# Patient Record
Sex: Female | Born: 1965 | Race: White | Hispanic: No | Marital: Married | Smoking: Never smoker
Health system: Southern US, Community
[De-identification: ages and names within clinical notes are randomized; demographics above are authoritative.]

## PROBLEM LIST (undated history)

## (undated) DIAGNOSIS — E785 Hyperlipidemia, unspecified: Secondary | ICD-10-CM

## (undated) DIAGNOSIS — I10 Essential (primary) hypertension: Secondary | ICD-10-CM

## (undated) HISTORY — DX: Essential (primary) hypertension: I10

## (undated) HISTORY — DX: Hyperlipidemia, unspecified: E78.5

---

## 1998-08-15 ENCOUNTER — Other Ambulatory Visit: Admission: RE | Admit: 1998-08-15 | Discharge: 1998-08-15 | Payer: Self-pay | Admitting: Obstetrics and Gynecology

## 1999-01-19 ENCOUNTER — Other Ambulatory Visit: Admission: RE | Admit: 1999-01-19 | Discharge: 1999-01-19 | Payer: Self-pay | Admitting: *Deleted

## 2000-02-02 ENCOUNTER — Other Ambulatory Visit: Admission: RE | Admit: 2000-02-02 | Discharge: 2000-02-02 | Payer: Self-pay | Admitting: *Deleted

## 2001-06-13 ENCOUNTER — Other Ambulatory Visit: Admission: RE | Admit: 2001-06-13 | Discharge: 2001-06-13 | Payer: Self-pay | Admitting: Obstetrics and Gynecology

## 2001-11-26 ENCOUNTER — Encounter: Payer: Self-pay | Admitting: Orthopedic Surgery

## 2001-11-26 ENCOUNTER — Inpatient Hospital Stay (HOSPITAL_COMMUNITY): Admission: EM | Admit: 2001-11-26 | Discharge: 2001-11-30 | Payer: Self-pay | Admitting: *Deleted

## 2002-06-16 ENCOUNTER — Other Ambulatory Visit: Admission: RE | Admit: 2002-06-16 | Discharge: 2002-06-16 | Payer: Self-pay | Admitting: Obstetrics and Gynecology

## 2002-06-26 HISTORY — PX: FRACTURE SURGERY: SHX138

## 2003-06-29 ENCOUNTER — Other Ambulatory Visit: Admission: RE | Admit: 2003-06-29 | Discharge: 2003-06-29 | Payer: Self-pay | Admitting: Obstetrics and Gynecology

## 2004-04-26 ENCOUNTER — Ambulatory Visit: Payer: Self-pay | Admitting: Internal Medicine

## 2005-12-05 ENCOUNTER — Ambulatory Visit: Payer: Self-pay | Admitting: Internal Medicine

## 2007-06-27 HISTORY — PX: COLONOSCOPY: SHX174

## 2007-07-18 ENCOUNTER — Ambulatory Visit: Payer: Self-pay | Admitting: Internal Medicine

## 2007-08-01 ENCOUNTER — Encounter: Payer: Self-pay | Admitting: Internal Medicine

## 2007-08-01 ENCOUNTER — Ambulatory Visit: Payer: Self-pay | Admitting: Internal Medicine

## 2009-06-10 ENCOUNTER — Emergency Department (HOSPITAL_COMMUNITY): Admission: EM | Admit: 2009-06-10 | Discharge: 2009-06-10 | Payer: Self-pay | Admitting: Emergency Medicine

## 2010-09-26 LAB — CBC
HCT: 35 % — ABNORMAL LOW (ref 36.0–46.0)
Hemoglobin: 12.3 g/dL (ref 12.0–15.0)
MCHC: 35.2 g/dL (ref 30.0–36.0)
MCV: 91.8 fL (ref 78.0–100.0)
Platelets: 254 10*3/uL (ref 150–400)
RBC: 3.81 MIL/uL — ABNORMAL LOW (ref 3.87–5.11)
RDW: 12.3 % (ref 11.5–15.5)
WBC: 12.8 10*3/uL — ABNORMAL HIGH (ref 4.0–10.5)

## 2010-09-26 LAB — URINALYSIS, ROUTINE W REFLEX MICROSCOPIC
Glucose, UA: NEGATIVE mg/dL
Ketones, ur: 40 mg/dL — AB
Nitrite: NEGATIVE
pH: 7.5 (ref 5.0–8.0)

## 2010-09-26 LAB — DIFFERENTIAL
Basophils Absolute: 0 10*3/uL (ref 0.0–0.1)
Basophils Relative: 0 % (ref 0–1)
Lymphocytes Relative: 9 % — ABNORMAL LOW (ref 12–46)
Monocytes Absolute: 0.1 10*3/uL (ref 0.1–1.0)
Neutro Abs: 11.6 10*3/uL — ABNORMAL HIGH (ref 1.7–7.7)
Neutrophils Relative %: 90 % — ABNORMAL HIGH (ref 43–77)

## 2010-09-26 LAB — POCT I-STAT, CHEM 8
BUN: 15 mg/dL (ref 6–23)
Calcium, Ion: 1.04 mmol/L — ABNORMAL LOW (ref 1.12–1.32)
Chloride: 110 mEq/L (ref 96–112)
HCT: 34 % — ABNORMAL LOW (ref 36.0–46.0)
Potassium: 3.7 mEq/L (ref 3.5–5.1)
Sodium: 139 mEq/L (ref 135–145)

## 2010-11-11 NOTE — Op Note (Signed)
[place]. [facility]  Patient:    Shelia Rush, Shelia Rush Visit Number: [id number] MRN: [id number]          Service Type: MED Location: 5000 [id number] Attending Physician:  Loanne Drilling Dictated by:   Ollen Gross, M.D. Proc. Date: 11/26/01 Admit Date:  11/26/2001                             Operative Report  PREOPERATIVE DIAGNOSIS:  Valgus impacted left femoral neck fracture.  POSTOPERATIVE DIAGNOSIS:  Valgus impacted left femoral neck fracture.  OPERATION PERFORMED:  In situ pinning, left femoral neck fracture.  SURGEON:  Ollen Gross, M.D.  ASSISTANT: None.  ANESTHESIA:  General.  ESTIMATED BLOOD LOSS:  Minimal.  DRAINS:  Hemovac times one.  COMPLICATIONS:  None.  CONDITION:  Stable to recovery.  INDICATIONS FOR PROCEDURE:  The patient is a 45 year old female who fell on a waxed floor earlier this evening, sustaining a valgus impacted left femoral neck fracture.  She presents now for in situ pinning.  DESCRIPTION OF PROCEDURE:  After successful administration of general anesthetic, the patient was placed on a Jackson fracture table and the left lower extremity was placed into the traction boot which was well padded.  The right lower extremity was placed in a Well leg holder.  Fluoro was brought into the field and the fracture was confirmed to be valgus impacted and under live fluoroscopy was moving as a complete unit.  The hip was then prepped and draped in the usual sterile fashion.  Under fluoroscopy guidance, guide pins placed over the thigh so as to align the center of the femoral head and neck for purposes of marking our incision.  Incision was made with a 10 blade.  The subcutaneous tissue to the level of the fascia lata was incised in line with the skin incision.  The fascia of the vastus lateralis was also incised.  A small incision made.  Muscle fibers split to go down to the lateral cortex of the femur. Under fluoroscopy  guidance, three parallel guide pins were placed such as to go at a 90 degree angle to the fracture line and enter the femoral head in the central and then slightly inferior position at the inferior most screw.  The pins were found to be in the center of the femoral neck on the lateral.  The lengths were 80, 80 and 75 mm respectively.  Once the screws were placed, the guide pins are removed and fluoro spots taken AP and lateral to confirm good position.  The wound was copiously irrigated with saline, then the fascia of the vastus lateralis was closed with interrupted #1 Vicryl, the fascia lata closed with interrupted #1 Vicryl and subcutaneous closed with #1 and then 2-0 Vicryl.  The subcuticular was closed with running 4-0 Monocryl. Incision was cleaned and dried, Steri-Strips and sterile bulky dressing applied.  The thigh was wrapped in an Ace wrap.  She was awakened and transported to recovery in stable condition. Dictated by:   Ollen Gross, M.D. Attending Physician:  Loanne Drilling DD:  11/26/01 TD:  11/28/01 Job: [id number]

## 2010-11-11 NOTE — Discharge Summary (Signed)
[place]. [facility]  Patient:    Shelia Rush, Shelia Rush Visit Number: [id number] MRN: [id number]          Service Type: MED Location: 5000 5025 01 Attending Physician:  Loanne Drilling Dictated by:   Ottie Glazier Wynona Neat, P.A.-C. Admit Date:  11/26/2001 Discharge Date: 11/30/2001                             Discharge Summary  ADMISSION DIAGNOSIS:  Left femoral neck fracture.  DISCHARGE DIAGNOSIS:  Left femoral neck fracture, improved.  PROCEDURES PERFORMED:  In situ pinning of the left femoral neck by Dr. Ollen Gross.  CONSULTS:  None.  HISTORY OF PRESENT ILLNESS:  The patient is a 45 year old female who was seen and evaluated in the [facility] ER by Dr. Ollen Gross on November 26, 2001, after sustaining a fall while waxing her floors at home. The patient states she felt immediate left buttock and immediate left hip pain with an inability to ambulate thereafter. On the time of examination the patient was found to have left hip tenderness to the greater trochanter, pain with any attempted range of motion. There was no left lower extremity deformity noted. Sensation was intact, 2+ dorsalis pedis pulses were intact. X-ray showed a valgus impacted left femoral neck fracture. Due to the nature of this injury, the decision was made to proceed with operative intervention in the form of in situ pinning.  The risks and benefits were discussed with the patient in great detail prior to entering the operating suite.  HOSPITAL COURSE:  The patient underwent the above procedure per Dr. Ollen Gross without complications. Please see operative report for details. On postoperative day #1, overall the patient was doing well, however, she was complaining of pruritus and mild tingling in her left foot. She was afebrile. Her vital signs were stable. Neurovascularly she was grossly intact in the left lower extremity. Her motor functioning showed good dorsi and  plantar flexion. Her dressings were clean, dry and intact. Benadryl was added in hopes to relieve her pruritus. She will begin physical therapy and occupational therapy that day, weightbearing as tolerated. Her hemoglobin was noted to be 11.2.  On postoperative day #1 the patient was without complaints, saying that her leg felt much better. She was afebrile and vital signs were stable. The left lower extremity incision was clean, dry and intact. Neurovascularly she was intact. She was to continue with physical therapy with the discharge plans being made for when she completed her goals with PT.  The following day the patient did complain of significant thigh pain. She was afebrile. She remained somewhat tachycardic. Vital signs were stable otherwise and the incision was healing well with no signs of any hematoma. Her quad strength was tested and found to be slowly returning.  On postoperative day #4 the patient was up in a chair in very good spirits. She had no complaints. Her pain was well controlled. She was eager for discharge. She had progressed well with physical therapy and in fact, after discussion with the physical therapist, it was decided that the patient was able for discharge to home at this time. Her vital signs were stable, she was afebrile and her INR was 2.1. The left hip showed her dressing was clean, dry and intact. The incision was clean, dry and intact. Motor and neurovascularly she was intact.  FINAL DIAGNOSIS:  Left hip pinning.  CONDITION ON DISCHARGE:  Improved.  DISCHARGE INSTRUCTIONS:  Regular diet. Activity is weightbearing as tolerated. Keep dressings clean, dry and intact. She may shower in the morning.  DISCHARGE MEDICATIONS: 1. Percocet 1 to 2 p.o. q.4-6h. p.r.n., #40. 2. Robaxin 1 p.o. q.6h. p.r.n., #40. 3. Coumadin 5 mg 1 p.o. q.d. per pharmacy, #25.  FOLLOWUP:  Follow up with Dr. Lequita Halt in two weeks. She is to call [phone] for an appointment.  Postoperatively she will be followed by [facility] for management of her outpatient physical therapy, occupational therapy and Coumadin management. Dictated by:   Ottie Glazier. Wynona Neat, P.A.-C. Attending Physician:  Loanne Drilling DD:  12/26/00 TD:  12/30/01 Job: [id number]

## 2012-07-26 ENCOUNTER — Encounter: Payer: Self-pay | Admitting: Internal Medicine

## 2013-12-16 ENCOUNTER — Ambulatory Visit (INDEPENDENT_AMBULATORY_CARE_PROVIDER_SITE_OTHER): Payer: 59 | Admitting: Emergency Medicine

## 2013-12-16 VITALS — BP 128/78 | HR 126 | Temp 100.2°F | Resp 20 | Ht 65.0 in | Wt 181.0 lb

## 2013-12-16 DIAGNOSIS — N10 Acute tubulo-interstitial nephritis: Secondary | ICD-10-CM

## 2013-12-16 DIAGNOSIS — R300 Dysuria: Secondary | ICD-10-CM

## 2013-12-16 LAB — POCT URINALYSIS DIPSTICK
Bilirubin, UA: NEGATIVE
Glucose, UA: NEGATIVE
Ketones, UA: NEGATIVE
Nitrite, UA: NEGATIVE
Protein, UA: 30
Spec Grav, UA: 1.01
Urobilinogen, UA: 0.2
pH, UA: 5.5

## 2013-12-16 LAB — POCT UA - MICROSCOPIC ONLY
Casts, Ur, LPF, POC: NEGATIVE
Crystals, Ur, HPF, POC: NEGATIVE
Mucus, UA: POSITIVE
Yeast, UA: NEGATIVE

## 2013-12-16 MED ORDER — CIPROFLOXACIN HCL 500 MG PO TABS: 500.0000 mg | ORAL_TABLET | Freq: Two times a day (BID) | ORAL | Status: DC

## 2013-12-16 MED ORDER — IBUPROFEN 600 MG PO TABS: 600.0000 mg | ORAL_TABLET | Freq: Three times a day (TID) | ORAL | Status: DC | PRN

## 2013-12-16 MED ORDER — PHENAZOPYRIDINE HCL 200 MG PO TABS: 200.0000 mg | ORAL_TABLET | Freq: Three times a day (TID) | ORAL | Status: DC | PRN

## 2013-12-16 NOTE — Patient Instructions (Signed)
Pyelonephritis, Adult °Pyelonephritis is a kidney infection. In general, there are 2 main types of pyelonephritis: °· Infections that come on quickly without any warning (acute pyelonephritis). °· Infections that persist for a long period of time (chronic pyelonephritis). °CAUSES  °Two main causes of pyelonephritis are: °· Bacteria traveling from the bladder to the kidney. This is a problem especially in pregnant women. The urine in the bladder can become filled with bacteria from multiple causes, including: °¨ Inflammation of the prostate gland (prostatitis). °¨ Sexual intercourse in females. °¨ Bladder infection (cystitis). °· Bacteria traveling from the bloodstream to the tissue part of the kidney. °Problems that may increase your risk of getting a kidney infection include: °· Diabetes. °· Kidney stones or bladder stones. °· Cancer. °· Catheters placed in the bladder. °· Other abnormalities of the kidney or ureter. °SYMPTOMS  °· Abdominal pain. °· Pain in the side or flank area. °· Fever. °· Chills. °· Upset stomach. °· Blood in the urine (dark urine). °· Frequent urination. °· Strong or persistent urge to urinate. °· Burning or stinging when urinating. °DIAGNOSIS  °Your caregiver may diagnose your kidney infection based on your symptoms. A urine sample may also be taken. °TREATMENT  °In general, treatment depends on how severe the infection is.  °· If the infection is mild and caught early, your caregiver may treat you with oral antibiotics and send you home. °· If the infection is more severe, the bacteria may have gotten into the bloodstream. This will require intravenous (IV) antibiotics and a hospital stay. Symptoms may include: °¨ High fever. °¨ Severe flank pain. °¨ Shaking chills. °· Even after a hospital stay, your caregiver may require you to be on oral antibiotics for a period of time. °· Other treatments may be required depending upon the cause of the infection. °HOME CARE INSTRUCTIONS  °· Take your  antibiotics as directed. Finish them even if you start to feel better. °· Make an appointment to have your urine checked to make sure the infection is gone. °· Drink enough fluids to keep your urine clear or pale yellow. °· Take medicines for the bladder if you have urgency and frequency of urination as directed by your caregiver. °SEEK IMMEDIATE MEDICAL CARE IF:  °· You have a fever or persistent symptoms for more than 2-3 days. °· You have a fever and your symptoms suddenly get worse. °· You are unable to take your antibiotics or fluids. °· You develop shaking chills. °· You experience extreme weakness or fainting. °· There is no improvement after 2 days of treatment. °MAKE SURE YOU: °· Understand these instructions. °· Will watch your condition. °· Will get help right away if you are not doing well or get worse. °Document Released: 06/12/2005 Document Revised: 12/12/2011 Document Reviewed: 11/16/2010 °ExitCare® Patient Information ©2015 [employer]. This information is not intended to replace advice given to you by your health care provider. Make sure you discuss any questions you have with your health care provider. ° °

## 2013-12-16 NOTE — Progress Notes (Signed)
   Subjective:    Patient ID: Shelia Rush, female    DOB: 01/21/1966, 48 y.o.   MRN: [id number]  HPI Dysuria, urgency and frequency.  Onset  Almost 2 weeks ago.  Progressed to back pain and fever with body aches over last 24 hours.  No nausea or vomiting.  No history of prior UTIs.  No UTI risk factors.  PPMH:  Negative for diabetes or hypertension  SH:  Nonsmoker, no alcohol.   Review of Systems  Constitutional: Positive for fever, chills and fatigue.  Respiratory: Negative for cough and shortness of breath.   Gastrointestinal: Negative for nausea, vomiting, abdominal pain, diarrhea and constipation.  Genitourinary: Positive for dysuria, urgency, frequency, flank pain and decreased urine volume.  Musculoskeletal: Positive for back pain. Negative for neck pain.  Neurological: Positive for headaches. Negative for weakness and numbness.  Otherwise all systems negative     Objective:   Physical Exam Blood pressure 128/78, pulse 126, temperature 100.2 F (37.9 C), temperature source Oral, resp. rate 20, height 5\' 5"  (1.651 m), weight 181 lb (82.101 kg), last menstrual period 11/30/2013, SpO2 96.00%. Body mass index is 30.12 kg/(m^2). Well-developed, well nourished female who is awake, alert and oriented, in NAD. HEENT: [place]/AT, PERRL, EOMI.  Sclera and conjunctiva are clear.   Neck: supple, non-tender, no lymphadenopathy, thyromegaly. Heart: RRR, no murmur Lungs: normal effort, CTA Back:  Mild bilateral CVA tenderness Abdomen: normo-active bowel sounds, supple, non-tender, no mass or organomegaly. Extremities: no cyanosis, clubbing or edema. Skin: warm and dry without rash. Psychologic: good mood and appropriate affect, normal speech and behavior.  Results for orders placed in visit on 12/16/13  POCT URINALYSIS DIPSTICK      Result Value Ref Range   Color, UA yellow     Clarity, UA hazy     Glucose, UA neg     Bilirubin, UA neg     Ketones, UA neg     Spec Grav, UA 1.010      Blood, UA tr-lysed     pH, UA 5.5     Protein, UA 30     Urobilinogen, UA 0.2     Nitrite, UA neg     Leukocytes, UA moderate (2+)    POCT UA - MICROSCOPIC ONLY      Result Value Ref Range   WBC, Ur, HPF, POC 30-40     RBC, urine, microscopic 5-10     Bacteria, U Microscopic 2+     Mucus, UA positive     Epithelial cells, urine per micros 3-7     Crystals, Ur, HPF, POC neg     Casts, Ur, LPF, POC neg     Yeast, UA neg         Assessment & Plan:  UTI/Pyelonephritis Will treat with Cipro x 10 days.  Pyridium prn and Motrin 600mg  Q6h.  Culture sent.  Patient has appointment with PCP in am and will keep it in am.

## 2013-12-17 ENCOUNTER — Telehealth: Payer: Self-pay

## 2013-12-17 ENCOUNTER — Encounter (HOSPITAL_COMMUNITY): Payer: Self-pay | Admitting: Emergency Medicine

## 2013-12-17 ENCOUNTER — Emergency Department (HOSPITAL_COMMUNITY)
Admission: EM | Admit: 2013-12-17 | Discharge: 2013-12-18 | Disposition: A | Discharge status: Home / Self Care | Payer: 59 | Attending: Emergency Medicine | Admitting: Emergency Medicine

## 2013-12-17 DIAGNOSIS — Z792 Long term (current) use of antibiotics: Secondary | ICD-10-CM | POA: Insufficient documentation

## 2013-12-17 DIAGNOSIS — N12 Tubulo-interstitial nephritis, not specified as acute or chronic: Secondary | ICD-10-CM

## 2013-12-17 DIAGNOSIS — R51 Headache: Secondary | ICD-10-CM | POA: Insufficient documentation

## 2013-12-17 DIAGNOSIS — Z3202 Encounter for pregnancy test, result negative: Secondary | ICD-10-CM | POA: Insufficient documentation

## 2013-12-17 DIAGNOSIS — R000 Tachycardia, unspecified: Secondary | ICD-10-CM | POA: Insufficient documentation

## 2013-12-17 DIAGNOSIS — Z791 Long term (current) use of non-steroidal anti-inflammatories (NSAID): Secondary | ICD-10-CM | POA: Insufficient documentation

## 2013-12-17 LAB — COMPREHENSIVE METABOLIC PANEL
ALBUMIN: 3.7 g/dL (ref 3.5–5.2)
ALK PHOS: 79 U/L (ref 39–117)
ALT: 19 U/L (ref 0–35)
AST: 21 U/L (ref 0–37)
BILIRUBIN TOTAL: 0.6 mg/dL (ref 0.3–1.2)
BUN: 6 mg/dL (ref 6–23)
CHLORIDE: 97 meq/L (ref 96–112)
CO2: 24 mEq/L (ref 19–32)
Calcium: 9.7 mg/dL (ref 8.4–10.5)
Creatinine, Ser: 0.98 mg/dL (ref 0.50–1.10)
GFR calc non Af Amer: 66 mL/min — ABNORMAL LOW (ref >90)
GFR, EST AFRICAN AMERICAN: 77 mL/min — AB (ref >90)
GLUCOSE: 159 mg/dL — AB (ref 70–99)
POTASSIUM: 3.4 meq/L — AB (ref 3.7–5.3)
SODIUM: 138 meq/L (ref 137–147)
Total Protein: 8.1 g/dL (ref 6.0–8.3)

## 2013-12-17 LAB — URINALYSIS, ROUTINE W REFLEX MICROSCOPIC
Bilirubin Urine: NEGATIVE
GLUCOSE, UA: NEGATIVE mg/dL
KETONES UR: NEGATIVE mg/dL
Nitrite: NEGATIVE
PH: 5 (ref 5.0–8.0)
Protein, ur: NEGATIVE mg/dL
SPECIFIC GRAVITY, URINE: 1.015 (ref 1.005–1.030)
Urobilinogen, UA: 0.2 mg/dL (ref 0.0–1.0)

## 2013-12-17 LAB — CBC WITH DIFFERENTIAL/PLATELET
Basophils Absolute: 0 10*3/uL (ref 0.0–0.1)
Basophils Relative: 0 % (ref 0–1)
Eosinophils Absolute: 0 10*3/uL (ref 0.0–0.7)
Eosinophils Relative: 0 % (ref 0–5)
HCT: 36.3 % (ref 36.0–46.0)
HEMOGLOBIN: 12.3 g/dL (ref 12.0–15.0)
LYMPHS ABS: 2 10*3/uL (ref 0.7–4.0)
LYMPHS PCT: 13 % (ref 12–46)
MCH: 30.4 pg (ref 26.0–34.0)
MCHC: 33.9 g/dL (ref 30.0–36.0)
MCV: 89.9 fL (ref 78.0–100.0)
MONOS PCT: 11 % (ref 3–12)
Monocytes Absolute: 1.7 10*3/uL — ABNORMAL HIGH (ref 0.1–1.0)
NEUTROS ABS: 11.6 10*3/uL — AB (ref 1.7–7.7)
NEUTROS PCT: 76 % (ref 43–77)
PLATELETS: 291 10*3/uL (ref 150–400)
RBC: 4.04 MIL/uL (ref 3.87–5.11)
RDW: 12.6 % (ref 11.5–15.5)
WBC: 15.3 10*3/uL — AB (ref 4.0–10.5)

## 2013-12-17 LAB — URINE MICROSCOPIC-ADD ON

## 2013-12-17 LAB — PREGNANCY, URINE: PREG TEST UR: NEGATIVE

## 2013-12-17 MED ORDER — SODIUM CHLORIDE 0.9 % IV BOLUS (SEPSIS)
1000.0000 mL | INTRAVENOUS | Status: AC
  Administered 2013-12-17: 1000 mL via INTRAVENOUS

## 2013-12-17 MED ORDER — DIPHENHYDRAMINE HCL 50 MG/ML IJ SOLN
25.0000 mg | Freq: Once | INTRAMUSCULAR | Status: AC
  Administered 2013-12-17: 25 mg via INTRAVENOUS
  Filled 2013-12-17: qty 1

## 2013-12-17 MED ORDER — CEFTRIAXONE SODIUM 1 G IJ SOLR
1.0000 g | Freq: Once | INTRAMUSCULAR | Status: AC
Start: 2013-12-17 — End: 2013-12-17
  Administered 2013-12-17: 1 g via INTRAVENOUS
  Filled 2013-12-17: qty 10

## 2013-12-17 MED ORDER — ONDANSETRON HCL 4 MG/2ML IJ SOLN
4.0000 mg | Freq: Once | INTRAMUSCULAR | Status: AC
  Administered 2013-12-17: 4 mg via INTRAVENOUS
  Filled 2013-12-17: qty 2

## 2013-12-17 MED ORDER — ONDANSETRON 4 MG PO TBDP
8.0000 mg | ORAL_TABLET | Freq: Once | ORAL | Status: AC
  Administered 2013-12-17: 8 mg via ORAL
  Filled 2013-12-17: qty 2

## 2013-12-17 MED ORDER — ACETAMINOPHEN 325 MG PO TABS
650.0000 mg | ORAL_TABLET | Freq: Once | ORAL | Status: AC
  Administered 2013-12-17: 650 mg via ORAL
  Filled 2013-12-17: qty 2

## 2013-12-17 MED ORDER — METOCLOPRAMIDE HCL 5 MG/ML IJ SOLN
5.0000 mg | Freq: Once | INTRAMUSCULAR | Status: AC
  Administered 2013-12-17: 5 mg via INTRAVENOUS
  Filled 2013-12-17: qty 2

## 2013-12-17 NOTE — ED Notes (Signed)
Pt. reports nausea and emesis today , diagnosed with UTI by PCP yesterday prescribed with Cipro / Pyridium and Ibuprofen , also endorses fever today .

## 2013-12-17 NOTE — ED Provider Notes (Signed)
CSN: [id number]     Arrival date & time 12/17/13  1905 History   First MD Initiated Contact with Patient 12/17/13 2156     Chief Complaint  Patient presents with  . Urinary Tract Infection  . Emesis     (Consider location/radiation/quality/duration/timing/severity/associated sxs/prior Treatment) Patient is a 48 y.o. female presenting with female genitourinary complaint. The history is provided by the patient.  Female GU Problem This is a new problem. The current episode started more than 1 week ago. The problem occurs constantly. The problem has been gradually worsening. Associated symptoms include headaches. Pertinent negatives include no chest pain, no abdominal pain and no shortness of breath. Nothing aggravates the symptoms. Nothing relieves the symptoms. Treatments tried: cipro x 1 day. The treatment provided no relief.    History reviewed. No pertinent past medical history. Past Surgical History  Procedure Laterality Date  . Cesarean section  1998  . Fracture surgery  2004    left hip   Family History  Problem Relation Age of Onset  . Diabetes Mother   . Heart disease Mother   . Hyperlipidemia Mother   . Hypertension Mother   . Mental illness Mother   . Diabetes Father   . Heart disease Father   . Hyperlipidemia Father   . Hypertension Father   . Stroke Father    History  Substance Use Topics  . Smoking status: Never Smoker   . Smokeless tobacco: Not on file  . Alcohol Use: No   OB History   Grav Para Term Preterm Abortions TAB SAB Ect Mult Living                 Review of Systems  Constitutional: Positive for fever. Negative for fatigue.  HENT: Negative for congestion and drooling.   Eyes: Negative for pain.  Respiratory: Negative for cough and shortness of breath.   Cardiovascular: Negative for chest pain.  Gastrointestinal: Positive for nausea and vomiting. Negative for abdominal pain and diarrhea.  Genitourinary: Negative for dysuria and hematuria.   Musculoskeletal: Negative for back pain, gait problem and neck pain.       Low back pain  Skin: Negative for color change.  Neurological: Positive for headaches. Negative for dizziness.  Hematological: Negative for adenopathy.  Psychiatric/Behavioral: Negative for behavioral problems.  All other systems reviewed and are negative.     Allergies  Review of patient's allergies indicates no known allergies.  Home Medications   Prior to Admission medications   Medication Sig Start Date End Date Taking? Authorizing Provider  ciprofloxacin (CIPRO) 500 MG tablet Take 1 tablet (500 mg total) by mouth 2 (two) times daily. 12/16/13  Yes Hennie Duos, MD  ibuprofen (ADVIL,MOTRIN) 200 MG tablet Take 400 mg by mouth every 6 (six) hours as needed.   Yes Historical Provider, MD  ibuprofen (ADVIL,MOTRIN) 600 MG tablet Take 1 tablet (600 mg total) by mouth every 8 (eight) hours as needed. 12/16/13  Yes Hennie Duos, MD   BP 113/64  Pulse 107  Temp(Src) 100.8 F (38.2 C) (Oral)  Resp 20  Wt 181 lb 3.2 oz (82.192 kg)  SpO2 96%  LMP 11/30/2013 Physical Exam  Nursing note and vitals reviewed. Constitutional: She is oriented to person, place, and time. She appears well-developed and well-nourished.  HENT:  Head: Normocephalic and atraumatic.  Mouth/Throat: Oropharynx is clear and moist. No oropharyngeal exudate.  Eyes: Conjunctivae and EOM are normal. Pupils are equal, round, and reactive to light.  Neck: Normal range  of motion. Neck supple.  Cardiovascular: Regular rhythm, normal heart sounds and intact distal pulses.  Exam reveals no gallop and no friction rub.   No murmur heard. tachycardic  Pulmonary/Chest: Effort normal and breath sounds normal. No respiratory distress. She has no wheezes.  Abdominal: Soft. Bowel sounds are normal. There is no tenderness. There is no rebound and no guarding.  Musculoskeletal: Normal range of motion. She exhibits no edema and no tenderness.  No  significant CVA tenderness bilaterally.  Neurological: She is alert and oriented to person, place, and time.  Skin: Skin is warm and dry.  Psychiatric: She has a normal mood and affect. Her behavior is normal.    ED Course  Procedures (including critical care time) Labs Review Labs Reviewed  URINALYSIS, ROUTINE W REFLEX MICROSCOPIC - Abnormal; Notable for the following:    Color, Urine AMBER (*)    APPearance CLOUDY (*)    Hgb urine dipstick TRACE (*)    Leukocytes, UA TRACE (*)    All other components within normal limits  CBC WITH DIFFERENTIAL - Abnormal; Notable for the following:    WBC 15.3 (*)    Neutro Abs 11.6 (*)    Monocytes Absolute 1.7 (*)    All other components within normal limits  COMPREHENSIVE METABOLIC PANEL - Abnormal; Notable for the following:    Potassium 3.4 (*)    Glucose, Bld 159 (*)    GFR calc non Af Amer 66 (*)    GFR calc Af Amer 77 (*)    All other components within normal limits  URINE CULTURE  PREGNANCY, URINE  URINE MICROSCOPIC-ADD ON    Imaging Review No results found.   EKG Interpretation None      MDM   Final diagnoses:  Pyelonephritis    11:32 PM 48 y.o. female who presents with urinary urgency and frequency which began slightly over one week ago. She developed a fever over the weekend and lower back pain and emesis today. She was seen at an urgent care yesterday and was prescribed Cipro. She took 2 tablets yesterday but was too nauseous to take any today. She was found to have low-grade fever here and is tachycardic. She is also complaining of a mild frontal headache which began 2-3 days ago and was gradual in onset. Screening labs and urine sent prior to my evaluation. Suspect pyelonephritis. Will give IV fluids, pain control, and Rocephin. Normal rom of neck, pt otherwise well appearing, do not think this is meningitis. Will tx HA.   12:10 AM: Although urine not impressive pt has very good story for pyelonephritis. Her HR has dec  appropriately, she is feeling better and tolerating po. She would like to go home and I think this is reasonable. Will start her on vantin. Given 1 day of abx I would not say she has failed outpt therapy but I would prefer to place her on a different antibiotic.  I have discussed the diagnosis/risks/treatment options with the patient and family and believe the pt to be eligible for discharge home to follow-up with pcp as needed. We also discussed returning to the ED immediately if new or worsening sx occur. We discussed the sx which are most concerning (e.g., inability to take abx, worsening pain, inc vomiting) that necessitate immediate return. Medications administered to the patient during their visit and any new prescriptions provided to the patient are listed below.  Medications given during this visit Medications  sodium chloride 0.9 % bolus 1,000 mL (1,000 mLs  Intravenous New Bag/Given 12/17/13 2326)  ondansetron (ZOFRAN-ODT) disintegrating tablet 8 mg (8 mg Oral Given 12/17/13 1918)  cefTRIAXone (ROCEPHIN) 1 g in dextrose 5 % 50 mL IVPB (0 g Intravenous Stopped 12/17/13 2310)  sodium chloride 0.9 % bolus 1,000 mL (0 mLs Intravenous Stopped 12/17/13 2311)  ondansetron (ZOFRAN) injection 4 mg (4 mg Intravenous Given 12/17/13 2218)  acetaminophen (TYLENOL) tablet 650 mg (650 mg Oral Given 12/17/13 2227)  metoCLOPramide (REGLAN) injection 5 mg (5 mg Intravenous Given 12/17/13 2221)  diphenhydrAMINE (BENADRYL) injection 25 mg (25 mg Intravenous Given 12/17/13 2221)    Discharge Medication List as of 12/18/2013 12:12 AM    START taking these medications   Details  cefpodoxime (VANTIN) 200 MG tablet Take 1 tablet (200 mg total) by mouth 2 (two) times daily., Starting 12/18/2013, Last dose on Sat 12/27/13, Print    ondansetron (ZOFRAN ODT) 4 MG disintegrating tablet 4mg  ODT q4 hours prn nausea/vomit, Print    oxyCODONE-acetaminophen (PERCOCET) 5-325 MG per tablet Take 1 tablet by mouth every 6 (six) hours  as needed for moderate pain., Starting 12/18/2013, Until Discontinued, Print         Blanchard Kelch, MD 12/18/13 1650

## 2013-12-17 NOTE — Telephone Encounter (Signed)
PT STATES THE MEDICINE SHE WAS GIVEN HAVE UPSET HER STOMACH, DIDN'T WANT TO COME BACK IN BUT WOULD LIKE A RETURN CALL TO [phone]

## 2013-12-18 LAB — URINE CULTURE: Colony Count: >=100000

## 2013-12-18 MED ORDER — CEFPODOXIME PROXETIL 200 MG PO TABS: 200.0000 mg | ORAL_TABLET | Freq: Two times a day (BID) | ORAL | Status: AC

## 2013-12-18 MED ORDER — OXYCODONE-ACETAMINOPHEN 5-325 MG PO TABS: 1.0000 | ORAL_TABLET | Freq: Four times a day (QID) | ORAL | Status: DC | PRN

## 2013-12-18 MED ORDER — ONDANSETRON 4 MG PO TBDP: ORAL_TABLET | ORAL | Status: DC

## 2013-12-18 NOTE — Discharge Instructions (Signed)
Pyelonephritis, Adult °Pyelonephritis is a kidney infection. A kidney infection can happen quickly, or it can last for a long time. °HOME CARE  °· Take your medicine (antibiotics) as told. Finish it even if you start to feel better. °· Keep all doctor visits as told. °· Drink enough fluids to keep your pee (urine) clear or pale yellow. °· Only take medicine as told by your doctor. °GET HELP RIGHT AWAY IF:  °· You have a fever or lasting symptoms for more than 2-3 days. °· You have a fever and your symptoms suddenly get worse. °· You cannot take your medicine or drink fluids as told. °· You have chills and shaking. °· You feel very weak or pass out (faint). °· You do not feel better after 2 days. °MAKE SURE YOU: °· Understand these instructions. °· Will watch your condition. °· Will get help right away if you are not doing well or get worse. °Document Released: 07/20/2004 Document Revised: 12/12/2011 Document Reviewed: 11/30/2010 °ExitCare® Patient Information ©2015 [employer]. This information is not intended to replace advice given to you by your health care provider. Make sure you discuss any questions you have with your health care provider. ° °

## 2013-12-18 NOTE — Telephone Encounter (Signed)
Spoke to patient she states she went to the ER. She was dehydrated and had to be treated.  I apologized to her and asked how she was doing.  She said she was much better, but she was very upset that we had not returned her call sooner.  I told her that it can take up to 24 hours for a call to be returned and she said she didn't understand why the doctor would tell her to call back and then not get an immediate response. I told her it was due to our large call volume. Again I apologized to her and she said ok.

## 2013-12-18 NOTE — ED Notes (Signed)
Discharge instructions reviewed with pt. Pt verbalized understanding. Pt refused wheelchair, family walked pt out.

## 2013-12-19 LAB — URINE CULTURE
CULTURE: NO GROWTH
Colony Count: NO GROWTH

## 2017-07-17 ENCOUNTER — Ambulatory Visit: Payer: Self-pay | Admitting: Nurse Practitioner

## 2018-05-26 ENCOUNTER — Emergency Department (HOSPITAL_BASED_OUTPATIENT_CLINIC_OR_DEPARTMENT_OTHER)
Admission: EM | Admit: 2018-05-26 | Discharge: 2018-05-26 | Disposition: A | Discharge status: Home / Self Care | Payer: 59 | Attending: Emergency Medicine | Admitting: Emergency Medicine

## 2018-05-26 ENCOUNTER — Encounter (HOSPITAL_BASED_OUTPATIENT_CLINIC_OR_DEPARTMENT_OTHER): Payer: Self-pay | Admitting: Emergency Medicine

## 2018-05-26 ENCOUNTER — Emergency Department (HOSPITAL_BASED_OUTPATIENT_CLINIC_OR_DEPARTMENT_OTHER): Payer: 59

## 2018-05-26 ENCOUNTER — Other Ambulatory Visit: Payer: Self-pay

## 2018-05-26 DIAGNOSIS — R300 Dysuria: Secondary | ICD-10-CM | POA: Diagnosis present

## 2018-05-26 DIAGNOSIS — Z79899 Other long term (current) drug therapy: Secondary | ICD-10-CM | POA: Insufficient documentation

## 2018-05-26 DIAGNOSIS — N12 Tubulo-interstitial nephritis, not specified as acute or chronic: Secondary | ICD-10-CM | POA: Diagnosis not present

## 2018-05-26 LAB — CBC WITH DIFFERENTIAL/PLATELET
Abs Immature Granulocytes: 0.08 10*3/uL — ABNORMAL HIGH (ref 0.00–0.07)
Basophils Absolute: 0 10*3/uL (ref 0.0–0.1)
Basophils Relative: 0 %
EOS PCT: 1 %
Eosinophils Absolute: 0.1 10*3/uL (ref 0.0–0.5)
HCT: 38.4 % (ref 36.0–46.0)
Hemoglobin: 12.8 g/dL (ref 12.0–15.0)
Immature Granulocytes: 1 %
Lymphocytes Relative: 28 %
Lymphs Abs: 2.6 10*3/uL (ref 0.7–4.0)
MCH: 30.3 pg (ref 26.0–34.0)
MCHC: 33.3 g/dL (ref 30.0–36.0)
MCV: 91 fL (ref 80.0–100.0)
Monocytes Absolute: 0.5 10*3/uL (ref 0.1–1.0)
Monocytes Relative: 6 %
Neutro Abs: 6.1 10*3/uL (ref 1.7–7.7)
Neutrophils Relative %: 64 %
Platelets: 269 10*3/uL (ref 150–400)
RBC: 4.22 MIL/uL (ref 3.87–5.11)
RDW: 12.8 % (ref 11.5–15.5)
WBC: 9.4 10*3/uL (ref 4.0–10.5)
nRBC: 0 % (ref 0.0–0.2)

## 2018-05-26 LAB — URINALYSIS, ROUTINE W REFLEX MICROSCOPIC
Bilirubin Urine: NEGATIVE
GLUCOSE, UA: 100 mg/dL — AB
Hgb urine dipstick: NEGATIVE
KETONES UR: NEGATIVE mg/dL
Nitrite: POSITIVE — AB
PROTEIN: NEGATIVE mg/dL
Specific Gravity, Urine: 1.015 (ref 1.005–1.030)
pH: 5.5 (ref 5.0–8.0)

## 2018-05-26 LAB — BASIC METABOLIC PANEL
Anion gap: 8 (ref 5–15)
BUN: 16 mg/dL (ref 6–20)
CO2: 25 mmol/L (ref 22–32)
Calcium: 9.4 mg/dL (ref 8.9–10.3)
Chloride: 107 mmol/L (ref 98–111)
Creatinine, Ser: 0.62 mg/dL (ref 0.44–1.00)
GFR calc Af Amer: >60 mL/min (ref >60)
GFR calc non Af Amer: >60 mL/min (ref >60)
Glucose, Bld: 100 mg/dL — ABNORMAL HIGH (ref 70–99)
Potassium: 4.1 mmol/L (ref 3.5–5.1)
Sodium: 140 mmol/L (ref 135–145)

## 2018-05-26 LAB — URINALYSIS, MICROSCOPIC (REFLEX): RBC / HPF: NONE SEEN RBC/hpf (ref 0–5)

## 2018-05-26 MED ORDER — CEFDINIR 300 MG PO CAPS: 300.0000 mg | ORAL_CAPSULE | Freq: Two times a day (BID) | ORAL | 0 refills | Status: DC

## 2018-05-26 NOTE — ED Triage Notes (Signed)
Epigastric abd pain, difficulty urinating, and back pain x 2 days.

## 2018-05-26 NOTE — ED Provider Notes (Signed)
[place] EMERGENCY DEPARTMENT Provider Note   CSN: [id number] Arrival date & time: 05/26/18  1016     History   Chief Complaint Chief Complaint  Patient presents with  . Abdominal Pain  . Back Pain    HPI Shelia Rush is a 52 y.o. female.  HPI Patient presents with dysuria.  Some urinary frequency.  States she feels like she has to go and then less will come out.  No fevers.  Some pain goes in the right flank and mild epigastric pain.  No nausea or vomiting.  Had a previous kidney infection also had previous kidney stones.  No sick contacts.  No vaginal bleeding or discharge. History reviewed. No pertinent past medical history.  There are no active problems to display for this patient.   Past Surgical History:  Procedure Laterality Date  . CESAREAN SECTION  1998  . FRACTURE SURGERY  2004   left hip     OB History   None      Home Medications    Prior to Admission medications   Medication Sig Start Date End Date Taking? Authorizing Provider  cefdinir (OMNICEF) 300 MG capsule Take 1 capsule (300 mg total) by mouth 2 (two) times daily. 05/26/18   Davonna Belling, MD  ciprofloxacin (CIPRO) 500 MG tablet Take 1 tablet (500 mg total) by mouth 2 (two) times daily. 12/16/13   Hennie Duos, MD  ibuprofen (ADVIL,MOTRIN) 200 MG tablet Take 400 mg by mouth every 6 (six) hours as needed.    [provider]  ibuprofen (ADVIL,MOTRIN) 600 MG tablet Take 1 tablet (600 mg total) by mouth every 8 (eight) hours as needed. 12/16/13   Hennie Duos, MD  ondansetron (ZOFRAN ODT) 4 MG disintegrating tablet 4mg  ODT q4 hours prn nausea/vomit 12/18/13   Pamella Pert, MD  oxyCODONE-acetaminophen (PERCOCET) 5-325 MG per tablet Take 1 tablet by mouth every 6 (six) hours as needed for moderate pain. 12/18/13   Pamella Pert, MD    Family History Family History  Problem Relation Age of Onset  . Diabetes Mother   . Heart disease Mother   . Hyperlipidemia  Mother   . Hypertension Mother   . Mental illness Mother   . Diabetes Father   . Heart disease Father   . Hyperlipidemia Father   . Hypertension Father   . Stroke Father     Social History Social History   Tobacco Use  . Smoking status: Never Smoker  . Smokeless tobacco: Never Used  Substance Use Topics  . Alcohol use: No  . Drug use: Not on file     Allergies   Patient has no known allergies.   Review of Systems Review of Systems  Constitutional: Negative for appetite change.  Respiratory: Negative for shortness of breath.   Gastrointestinal: Positive for abdominal pain.  Genitourinary: Positive for dysuria, flank pain, frequency and urgency. Negative for hematuria and pelvic pain.  Musculoskeletal: Positive for back pain.  Skin: Negative for rash.  Neurological: Negative for weakness.  Psychiatric/Behavioral: Negative for confusion.     Physical Exam Updated Vital Signs BP (!) 163/83 (BP Location: Left Arm)   Pulse 70   Temp 98.8 F (37.1 C) (Oral)   Resp 16   Ht 5' 5.5" (1.664 m)   Wt 86.2 kg   LMP 11/30/2013   SpO2 98%   BMI 31.14 kg/m   Physical Exam  Constitutional: She appears well-developed.  HENT:  Head: Normocephalic.  Eyes: Pupils are  equal, round, and reactive to light.  Cardiovascular: Normal rate.  Pulmonary/Chest: Breath sounds normal.  Abdominal: Normal appearance. There is tenderness.  Mild suprapubic tenderness without rebound or guarding.  No upper abdominal tenderness.  No hernia palpated.  Genitourinary:  Genitourinary Comments: Mild CVA tenderness on right.  Skin: Skin is warm. Capillary refill takes less than 2 seconds.     ED Treatments / Results  Labs (all labs ordered are listed, but only abnormal results are displayed) Labs Reviewed  URINALYSIS, ROUTINE W REFLEX MICROSCOPIC - Abnormal; Notable for the following components:      Result Value   Color, Urine ORANGE (*)    Glucose, UA 100 (*)    Nitrite POSITIVE (*)      Leukocytes, UA TRACE (*)    All other components within normal limits  URINALYSIS, MICROSCOPIC (REFLEX) - Abnormal; Notable for the following components:   Bacteria, UA FEW (*)    All other components within normal limits  CBC WITH DIFFERENTIAL/PLATELET - Abnormal; Notable for the following components:   Abs Immature Granulocytes 0.08 (*)    All other components within normal limits  BASIC METABOLIC PANEL - Abnormal; Notable for the following components:   Glucose, Bld 100 (*)    All other components within normal limits  URINE CULTURE    EKG None  Radiology US Renal  Result Date: 05/26/2018 CLINICAL DATA:  Right flank pain EXAM: RENAL / URINARY TRACT ULTRASOUND COMPLETE COMPARISON:  CT abdomen pelvis 06/10/2009 FINDINGS: Right Kidney: Renal measurements: 12.2 x 4.1 x 4.5 cm = volume: 116 mL. Mild right hydronephrosis. Renal cortex normal in thickness and echogenicity. Negative for mass lesion. Left Kidney: Renal measurements: 11.5 x 4.5 x 4.1 cm = volume: 110 mL. Echogenicity within normal limits. No mass or hydronephrosis visualized. Bladder: Empty urinary bladder. IMPRESSION: Mild right hydronephrosis. This could be due to ureteral stone or stricture. No right renal calculi on the CT from 2010 Normal left kidney Electronically Signed   By: Franchot Gallo M.D.   On: 05/26/2018 13:18   Ct Renal Stone Study  Result Date: 05/26/2018 CLINICAL DATA:  Right flank pain and occasional urinary frequency and urgency for 2 days. EXAM: CT ABDOMEN AND PELVIS WITHOUT CONTRAST TECHNIQUE: Multidetector CT imaging of the abdomen and pelvis was performed following the standard protocol without IV contrast. COMPARISON:  CT abdomen and pelvis 06/10/2009. FINDINGS: Lower chest: Lung bases are clear. No pleural or pericardial effusion. Hepatobiliary: No focal liver abnormality is seen. No gallstones, gallbladder wall thickening, or biliary dilatation. Pancreas: Unremarkable. No pancreatic ductal dilatation or  surrounding inflammatory changes. Spleen: Normal in size without focal abnormality. Adrenals/Urinary Tract: Adrenal glands are unremarkable. Kidneys are normal, without renal calculi, focal lesion, or hydronephrosis. Bladder is unremarkable. Stomach/Bowel: Stomach is within normal limits. Appendix appears normal. No evidence of bowel wall thickening, distention, or inflammatory changes. Vascular/Lymphatic: No significant vascular findings are present. No enlarged abdominal or pelvic lymph nodes. Reproductive: Uterus and bilateral adnexa are unremarkable. Other: None. Musculoskeletal: No acute or focal abnormality. Healed left hip fracture with fixation hardware in place noted. IMPRESSION: Negative for urinary tract stone. No acute abnormality or finding to explain the patient's symptoms. Electronically Signed   By: Inge Rise M.D.   On: 05/26/2018 14:29    Procedures Procedures (including critical care time)  Medications Ordered in ED Medications - No data to display   Initial Impression / Assessment and Plan / ED Course  I have reviewed the triage vital signs and the  nursing notes.  Pertinent labs & imaging results that were available during my care of the patient were reviewed by me and considered in my medical decision making (see chart for details).     Patient with abdominal pain and back pain.  Likely pyelonephritis.  Initial ultrasound showed possible dilatation on right.  Had history is constant.  CT scan done and did not show a normalities.  Discharge home.  Culture sent and will be followed by as an outpatient  Final Clinical Impressions(s) / ED Diagnoses   Final diagnoses:  Pyelonephritis    ED Discharge Orders         Ordered    cefdinir (OMNICEF) 300 MG capsule  2 times daily     05/26/18 1450           Davonna Belling, MD 05/26/18 1538

## 2018-05-26 NOTE — ED Notes (Signed)
ED Provider at bedside. 

## 2018-05-26 NOTE — Discharge Instructions (Signed)
The culture results will come back the next 2 days.  You will be notified if they do not match up with your antibiotic coverage

## 2018-05-28 LAB — URINE CULTURE: Culture: 70000 — AB

## 2018-05-29 ENCOUNTER — Telehealth: Payer: Self-pay | Admitting: Emergency Medicine

## 2018-05-29 NOTE — Telephone Encounter (Signed)
Post ED Visit - Positive Culture Follow-up  Culture report reviewed by antimicrobial stewardship pharmacist:  []  Elenor Quinones, Pharm.D. []  Heide Guile, Pharm.D., BCPS AQ-ID []  Parks Neptune, Pharm.D., BCPS []  Alycia Rossetti, Pharm.D., BCPS []  Fidelity, Pharm.D., BCPS, AAHIVP []  Legrand Como, Pharm.D., BCPS, AAHIVP []  Salome Arnt, PharmD, BCPS []  Johnnette Gourd, PharmD, BCPS [x]  Hughes Better, PharmD, BCPS []  Leeroy Cha, PharmD  Positive urine culture Treated with cefdinir, organism sensitive to the same and no further patient follow-up is required at this time.  Hazle Nordmann 05/29/2018, 10:33 AM

## 2021-10-11 ENCOUNTER — Encounter: Payer: Self-pay | Admitting: Cardiology

## 2021-10-11 ENCOUNTER — Ambulatory Visit: Payer: 59 | Admitting: Cardiology

## 2021-10-11 VITALS — BP 167/90 | HR 89 | Temp 98.0°F | Resp 16 | Ht 65.0 in | Wt 183.0 lb

## 2021-10-11 DIAGNOSIS — R0609 Other forms of dyspnea: Secondary | ICD-10-CM

## 2021-10-11 DIAGNOSIS — R002 Palpitations: Secondary | ICD-10-CM

## 2021-10-11 DIAGNOSIS — Z8249 Family history of ischemic heart disease and other diseases of the circulatory system: Secondary | ICD-10-CM

## 2021-10-11 DIAGNOSIS — I10 Essential (primary) hypertension: Secondary | ICD-10-CM

## 2021-10-11 DIAGNOSIS — E7800 Pure hypercholesterolemia, unspecified: Secondary | ICD-10-CM

## 2021-10-11 MED ORDER — HYDROCHLOROTHIAZIDE 12.5 MG PO CAPS: 12.5000 mg | ORAL_CAPSULE | Freq: Every morning | ORAL | 0 refills | Status: DC

## 2021-10-11 MED ORDER — LOSARTAN POTASSIUM 25 MG PO TABS: 25.0000 mg | ORAL_TABLET | Freq: Every day | ORAL | 0 refills | Status: DC

## 2021-10-11 NOTE — Progress Notes (Signed)
? ?ID:  Shelia Rush, DOB 07-03-1965, MRN [id number] ? ?PCP:  Berkley Harvey, NP  ?Cardiologist:  Rex Kras, DO, [facility] (established care 10/11/2021) ? ?REASON FOR CONSULT: Tachycardia ? ?REQUESTING PHYSICIAN:  ?Berkley Harvey, NP ?[address] ?[address] ?[place],  [place] [address] ? ?Chief Complaint  ?Patient presents with  ? Tachycardia  ? New Patient (Initial Visit)  ? ? ?HPI  ?Shelia Rush is a 56 y.o. Caucasian female whose past medical history and cardiovascular risk factors include: HTN, HLD, family hx of premature CAD.  ? ?She is referred to the office at the request of Berkley Harvey, NP for evaluation of tachycardia. ? ?A very pleasant 56 year old Caucasian female who presents to the office for evaluation of tachycardia/palpitations, elevated blood pressures, shortness of breath with effort related activities.  In the recent past patient was busy taking care of her husband as he was on hemodialysis and has not followed up with medical providers in the last 10 to 12 years. ? ?Palpitations: ?Palpitations are occurring randomly, more noticeable over the last 1 year, intermittently present, duration a few minutes, no near-syncope or syncopal events.  She rarely consumes coffee.  No consumption of soda, alcohol, illicit drugs, energy drinks, herbal supplements, or over-the-counter medications.  Her thyroid function and hemoglobin within acceptable limits.   ? ?Patient was recently diagnosed with hyperlipidemia and is currently on pravastatin. ? ?Benign essential hypertension: ?Patient states that she has been told in the recent past that her blood pressure is elevated.  She does not check it regularly at home.  Currently not on pharmacological therapy.  Denies headaches or focal neurological deficits.  She does have shortness of breath with effort related activities. ? ?Patient denies angina pectoris. ? ? ?FUNCTIONAL STATUS: ?No structured exercise program or daily routine.  ? ?ALLERGIES: ?No Known  Allergies ? ?MEDICATION LIST PRIOR TO VISIT: ?Current Meds  ?Medication Sig  ? hydrochlorothiazide (MICROZIDE) 12.5 MG capsule Take 1 capsule (12.5 mg total) by mouth every morning.  ? losartan (COZAAR) 25 MG tablet Take 1 tablet (25 mg total) by mouth daily at 10 pm.  ? pravastatin (PRAVACHOL) 20 MG tablet Take 1 tablet by mouth daily.  ?  ? ?PAST MEDICAL HISTORY: ?Past Medical History:  ?Diagnosis Date  ? Hyperlipidemia   ? Hypertension   ? ? ?PAST SURGICAL HISTORY: ?Past Surgical History:  ?Procedure Laterality Date  ? [place]  ? FRACTURE SURGERY  2004  ? left hip  ? ? ?FAMILY HISTORY: ?The patient family history includes Diabetes in her father and mother; Heart failure in her father and mother; Hyperlipidemia in her father and mother; Hypertension in her father and mother; Mental illness in her mother; Stroke in her father. ? ?SOCIAL HISTORY:  ?The patient  reports that she has never smoked. She has never used smokeless tobacco. She reports that she does not drink alcohol and does not use drugs. ? ?REVIEW OF SYSTEMS: ?Review of Systems  ?Constitutional: Positive for malaise/fatigue.  ?Cardiovascular:  Positive for dyspnea on exertion and palpitations. Negative for chest pain, leg swelling, near-syncope, orthopnea, paroxysmal nocturnal dyspnea and syncope.  ?Respiratory:  Positive for shortness of breath.   ?Neurological:  Positive for dizziness and light-headedness. Negative for headaches.  ? ?PHYSICAL EXAM: ? ?  10/11/2021  ?  9:27 AM 10/11/2021  ?  9:15 AM 05/26/2018  ?  2:49 PM  ?Vitals with BMI  ?Height  '5\' 5"'$    ?Weight  183  lbs   ?BMI  30.45   ?Systolic [phone]  ?Diastolic 90 95 83  ?Pulse 89 92 70  ? ? ?CONSTITUTIONAL: Well-developed and well-nourished. No acute distress.  ?SKIN: Skin is warm and dry. No rash noted. No cyanosis. No pallor. No jaundice ?HEAD: Normocephalic and atraumatic.  ?EYES: No scleral icterus ?MOUTH/THROAT: Moist oral membranes.  ?NECK: No JVD present. No thyromegaly  noted. No carotid bruits  ?LYMPHATIC: No visible cervical adenopathy.  ?CHEST Normal respiratory effort. No intercostal retractions  ?LUNGS: Clear to auscultation bilaterally.  No stridor. No wheezes. No rales.  ?CARDIOVASCULAR: Regular rate and rhythm, positive S1-S2, no murmurs rubs or gallops appreciated. ?ABDOMINAL: Soft, nontender, nondistended, positive bowel sounds in all 4 quadrants, no apparent ascites.  ?EXTREMITIES: No peripheral edema, warm to touch, 2+ bilateral DP and PT pulses ?HEMATOLOGIC: No significant bruising ?NEUROLOGIC: Oriented to person, place, and time. Nonfocal. Normal muscle tone.  ?PSYCHIATRIC: Normal mood and affect. Normal behavior. Cooperative ? ?CARDIAC DATABASE: ?EKG: ?10/11/2021: Normal sinus rhythm, 87 bpm, nonspecific T wave abnormality, without underlying injury pattern. ? ?Echocardiogram: ?No results found for this or any previous visit from the past 1095 days. ?  ?Stress Testing: ?No results found for this or any previous visit from the past 1095 days. ? ?Heart Catheterization: ?None ? ?LABORATORY DATA: ?External Labs: ?Collected: 09/09/2021. ?Hemoglobin 13.4 g/dL, hematocrit 39.8%. ?TSH is 0.91. ?Hemoglobin A1c 6. ?Total cholesterol 227, triglycerides 112, HDL 50, LDL 155, non-HDL 177. ?Sodium 138, potassium 3.8 Chloride 103, bicarb 22, BUN 12, creatinine 0.72. ?AST 22, ALT 19 ? ?IMPRESSION: ? ?  ICD-10-CM   ?1. Palpitation  R00.2 EKG 12-Lead  ?  ?2. Dyspnea on exertion  R06.09 PCV ECHOCARDIOGRAM COMPLETE  ?  Pro b natriuretic peptide (BNP)  ?  ?3. Benign hypertension  I10 hydrochlorothiazide (MICROZIDE) 12.5 MG capsule  ?  losartan (COZAAR) 25 MG tablet  ?  Basic metabolic panel  ?  Magnesium  ?  PCV ECHOCARDIOGRAM COMPLETE  ?  ?4. Hypercholesteremia  E78.00 CT CARDIAC SCORING ([facility] LOCATIONS ONLY)  ?  ?5. Family history of premature CAD  Z82.49 CT CARDIAC SCORING ([facility] LOCATIONS ONLY)  ?  ?  ? ?RECOMMENDATIONS: ?Shelia Rush is a 56 y.o. Caucasian female whose past medical  history and cardiac risk factors include: HTN, HLD, family hx of premature CAD.  ? ?Palpitation ?EKG shows normal sinus rhythm without any significant ectopy. ?I suspect that her palpitation episodes are due to her shortness of breath/uncontrolled hypertension. ?I would like to start her on antihypertensive medications for now and reevaluate the severity of palpitations.  If the symptoms continue we will consider monitor. ?Patient is asked to seek medical attention sooner if she experiences near-syncope or syncope. ? ?Dyspnea on exertion ?Likely secondary to uncontrolled hypertension. ?Does also have family history of premature CAD and no cardiac or PCP follow-up for the last 10 to 12 years. ?Coronary calcium score for further risk stratification ?Echo will be ordered to evaluate for structural heart disease and left ventricular systolic function. ?Check BMP ? ?Benign hypertension ?Start losartan 25 mg p.o. every afternoon. ?Start HCTZ 12.5 mg p.o. every morning. ?Labs in 1 week to evaluate kidney function and electrolytes. ?Low-salt diet recommended. ? ?Hypercholesteremia ?Indexed LDL was reported to be 155 mg/dL. ?Currently on pravastatin. ?Currently managed by primary care provider. ? ?Family history of premature CAD ?Both mom and dad had premature CAD as discussed above. ?Currently on statin therapy. ?Coronary calcium score, echocardiogram as discussed above. ?Further recommendations to  follow. ? ?As part of today's office visit reviewed outside records provided by PCP, labs independently reviewed and noted above for further reference, ordered and independently reviewed EKG, initiation of medical therapy, and ordered additional diagnostic testing for further evaluation. ? ?FINAL MEDICATION LIST END OF ENCOUNTER: ?Meds ordered this encounter  ?Medications  ? hydrochlorothiazide (MICROZIDE) 12.5 MG capsule  ?  Sig: Take 1 capsule (12.5 mg total) by mouth every morning.  ?  Dispense:  30 capsule  ?  Refill:  0  ?  losartan (COZAAR) 25 MG tablet  ?  Sig: Take 1 tablet (25 mg total) by mouth daily at 10 pm.  ?  Dispense:  90 tablet  ?  Refill:  0  ?  ?Medications Discontinued During This Encounter  ?Medication Reason

## 2021-10-17 ENCOUNTER — Other Ambulatory Visit: Payer: Self-pay

## 2021-10-17 DIAGNOSIS — R0609 Other forms of dyspnea: Secondary | ICD-10-CM

## 2021-10-17 DIAGNOSIS — I10 Essential (primary) hypertension: Secondary | ICD-10-CM

## 2021-10-18 ENCOUNTER — Ambulatory Visit: Payer: 59

## 2021-10-18 DIAGNOSIS — I10 Essential (primary) hypertension: Secondary | ICD-10-CM

## 2021-10-18 DIAGNOSIS — R0609 Other forms of dyspnea: Secondary | ICD-10-CM

## 2021-10-19 LAB — BASIC METABOLIC PANEL
BUN/Creatinine Ratio: 17 (ref 9–23)
BUN: 14 mg/dL (ref 6–24)
CO2: 23 mmol/L (ref 20–29)
Calcium: 10.4 mg/dL — ABNORMAL HIGH (ref 8.7–10.2)
Chloride: 97 mmol/L (ref 96–106)
Creatinine, Ser: 0.84 mg/dL (ref 0.57–1.00)
Glucose: 104 mg/dL — ABNORMAL HIGH (ref 70–99)
Potassium: 4.4 mmol/L (ref 3.5–5.2)
Sodium: 142 mmol/L (ref 134–144)
eGFR: 80 mL/min/{1.73_m2} (ref >59)

## 2021-10-19 LAB — MAGNESIUM: Magnesium: 2.3 mg/dL (ref 1.6–2.3)

## 2021-10-19 LAB — PRO B NATRIURETIC PEPTIDE: NT-Pro BNP: <36 pg/mL (ref 0–287)

## 2021-11-07 ENCOUNTER — Other Ambulatory Visit: Payer: Self-pay | Admitting: Cardiology

## 2021-11-07 DIAGNOSIS — I10 Essential (primary) hypertension: Secondary | ICD-10-CM

## 2021-11-08 ENCOUNTER — Telehealth: Payer: Self-pay

## 2021-11-09 ENCOUNTER — Ambulatory Visit
Admission: RE | Admit: 2021-11-09 | Discharge: 2021-11-09 | Disposition: A | Discharge status: Home / Self Care | Payer: No Typology Code available for payment source | Source: Ambulatory Visit | Attending: Cardiology | Admitting: Cardiology

## 2021-11-09 DIAGNOSIS — Z8249 Family history of ischemic heart disease and other diseases of the circulatory system: Secondary | ICD-10-CM

## 2021-11-09 DIAGNOSIS — E7800 Pure hypercholesterolemia, unspecified: Secondary | ICD-10-CM

## 2021-12-02 NOTE — Progress Notes (Signed)
Called and spoke to pt, pt voiced understanding.

## 2021-12-12 ENCOUNTER — Other Ambulatory Visit: Payer: Self-pay | Admitting: Cardiology

## 2021-12-12 DIAGNOSIS — I10 Essential (primary) hypertension: Secondary | ICD-10-CM

## 2021-12-16 ENCOUNTER — Ambulatory Visit: Payer: 59 | Admitting: Cardiology

## 2021-12-16 ENCOUNTER — Encounter: Payer: Self-pay | Admitting: Cardiology

## 2021-12-16 ENCOUNTER — Inpatient Hospital Stay: Payer: 59

## 2021-12-16 VITALS — BP 134/87 | HR 81 | Temp 98.7°F | Resp 16 | Ht 65.0 in | Wt 178.0 lb

## 2021-12-16 DIAGNOSIS — R9431 Abnormal electrocardiogram [ECG] [EKG]: Secondary | ICD-10-CM

## 2021-12-16 DIAGNOSIS — R931 Abnormal findings on diagnostic imaging of heart and coronary circulation: Secondary | ICD-10-CM

## 2021-12-16 DIAGNOSIS — R0609 Other forms of dyspnea: Secondary | ICD-10-CM

## 2021-12-16 DIAGNOSIS — R002 Palpitations: Secondary | ICD-10-CM

## 2021-12-16 DIAGNOSIS — I10 Essential (primary) hypertension: Secondary | ICD-10-CM

## 2021-12-16 DIAGNOSIS — E7800 Pure hypercholesterolemia, unspecified: Secondary | ICD-10-CM

## 2021-12-16 DIAGNOSIS — Z8249 Family history of ischemic heart disease and other diseases of the circulatory system: Secondary | ICD-10-CM

## 2021-12-16 MED ORDER — METOPROLOL TARTRATE 25 MG PO TABS: 25.0000 mg | ORAL_TABLET | Freq: Two times a day (BID) | ORAL | 0 refills | Status: DC

## 2022-01-09 ENCOUNTER — Other Ambulatory Visit: Payer: Self-pay | Admitting: Cardiology

## 2022-01-09 DIAGNOSIS — I10 Essential (primary) hypertension: Secondary | ICD-10-CM

## 2022-01-09 NOTE — Progress Notes (Signed)
Called pt and transferred her to the front to make an f/u appt

## 2022-01-10 LAB — CMP14+EGFR
ALT: 16 IU/L (ref 0–32)
AST: 21 IU/L (ref 0–40)
Albumin/Globulin Ratio: 1.6 (ref 1.2–2.2)
Albumin: 4.3 g/dL (ref 3.8–4.9)
Alkaline Phosphatase: 60 IU/L (ref 44–121)
BUN/Creatinine Ratio: 15 (ref 9–23)
BUN: 13 mg/dL (ref 6–24)
Bilirubin Total: 0.4 mg/dL (ref 0.0–1.2)
CO2: 24 mmol/L (ref 20–29)
Calcium: 9.3 mg/dL (ref 8.7–10.2)
Chloride: 102 mmol/L (ref 96–106)
Creatinine, Ser: 0.84 mg/dL (ref 0.57–1.00)
Globulin, Total: 2.7 g/dL (ref 1.5–4.5)
Glucose: 109 mg/dL — ABNORMAL HIGH (ref 70–99)
Potassium: 3.8 mmol/L (ref 3.5–5.2)
Sodium: 141 mmol/L (ref 134–144)
Total Protein: 7 g/dL (ref 6.0–8.5)
eGFR: 80 mL/min/{1.73_m2} (ref >59)

## 2022-01-10 LAB — LIPID PANEL WITH LDL/HDL RATIO
Cholesterol, Total: 173 mg/dL (ref 100–199)
HDL: 38 mg/dL — ABNORMAL LOW (ref >39)
LDL Chol Calc (NIH): 103 mg/dL — ABNORMAL HIGH (ref 0–99)
LDL/HDL Ratio: 2.7 ratio (ref 0.0–3.2)
Triglycerides: 182 mg/dL — ABNORMAL HIGH (ref 0–149)
VLDL Cholesterol Cal: 32 mg/dL (ref 5–40)

## 2022-01-10 LAB — LDL CHOLESTEROL, DIRECT: LDL Direct: 106 mg/dL — ABNORMAL HIGH (ref 0–99)

## 2022-01-13 ENCOUNTER — Telehealth (HOSPITAL_COMMUNITY): Payer: Self-pay | Admitting: *Deleted

## 2022-01-13 NOTE — Telephone Encounter (Signed)
Reaching out to patient to offer assistance regarding upcoming cardiac imaging study; pt verbalizes understanding of appt date/time, parking situation and where to check in, pre-test NPO status and medications ordered, and verified current allergies; name and call back number provided for further questions should they arise  Shelia Clement RN Navigator Cardiac Imaging [facility] [phone] office [phone] cell  Patient takes her AM metoprolol tartrate two hours prior to her cardiac CT scan. She is aware to arrive at 8am.

## 2022-01-16 ENCOUNTER — Ambulatory Visit (HOSPITAL_COMMUNITY)
Admission: RE | Admit: 2022-01-16 | Discharge: 2022-01-16 | Disposition: A | Discharge status: Home / Self Care | Payer: 59 | Source: Ambulatory Visit | Attending: Nurse Practitioner | Admitting: Nurse Practitioner

## 2022-01-16 DIAGNOSIS — R0609 Other forms of dyspnea: Secondary | ICD-10-CM | POA: Diagnosis present

## 2022-01-16 DIAGNOSIS — R9431 Abnormal electrocardiogram [ECG] [EKG]: Secondary | ICD-10-CM | POA: Diagnosis present

## 2022-01-16 DIAGNOSIS — R931 Abnormal findings on diagnostic imaging of heart and coronary circulation: Secondary | ICD-10-CM | POA: Insufficient documentation

## 2022-01-16 MED ORDER — NITROGLYCERIN 0.4 MG SL SUBL
SUBLINGUAL_TABLET | SUBLINGUAL | Status: AC
  Filled 2022-01-16: qty 2

## 2022-01-16 MED ORDER — IOHEXOL 350 MG/ML SOLN
100.0000 mL | Freq: Once | INTRAVENOUS | Status: AC | PRN
  Administered 2022-01-16: 100 mL via INTRAVENOUS

## 2022-01-16 MED ORDER — NITROGLYCERIN 0.4 MG SL SUBL
0.8000 mg | SUBLINGUAL_TABLET | Freq: Once | SUBLINGUAL | Status: AC
  Administered 2022-01-16: 0.8 mg via SUBLINGUAL

## 2022-01-16 NOTE — Progress Notes (Signed)
  Evaluation after Contrast Extravasation  Patient seen and examined immediately after contrast extravasation while in CT.  Exam: There is swelling at the right [facility] area.  There is some discoloration--pink in comparison to surrounding skin. There are no blisters. There are no signs of decreased perfusion of the skin.  It is mildly warm to touch.  The patient has full ROM in fingers.  Radial pulse is normal.  Per contrast extravasation protocol, I have instructed the patient to keep an ice pack on the area for 20-60 minutes at a time for about 48 hours.   Keep arm elevated as much as possible.   The patient understands to call the radiology department if there is: - increase in pain or swelling - changed or altered sensation - ulceration or blistering - increasing redness - warmth or increasing firmness - decreased tissue perfusion as noted by decreased capillary refill or discoloration of skin - decreased pulses peripheral to site   [employer] PA-C 01/16/2022 9:46 AM

## 2022-01-16 NOTE — Progress Notes (Signed)
This patient has received approx 10-15 mls of omni350 and 40 mls of normal saline extravasation/infiltration into the Right antecubital during a CT Coronary exam.  The exam was performed on (date) Monday, July 24th, 2023 at approx 08:50am.  Site / affected area assessed by Viann Shove, Radiology PA

## 2022-01-17 ENCOUNTER — Other Ambulatory Visit: Payer: Self-pay | Admitting: Cardiology

## 2022-01-17 DIAGNOSIS — R0609 Other forms of dyspnea: Secondary | ICD-10-CM

## 2022-01-17 DIAGNOSIS — R931 Abnormal findings on diagnostic imaging of heart and coronary circulation: Secondary | ICD-10-CM

## 2022-01-17 DIAGNOSIS — R9431 Abnormal electrocardiogram [ECG] [EKG]: Secondary | ICD-10-CM

## 2022-01-18 ENCOUNTER — Ambulatory Visit (AMBULATORY_SURGERY_CENTER): Payer: Self-pay | Admitting: *Deleted

## 2022-01-18 VITALS — Ht 65.0 in | Wt 179.4 lb

## 2022-01-18 DIAGNOSIS — Z86010 Personal history of colonic polyps: Secondary | ICD-10-CM

## 2022-01-18 MED ORDER — NA SULFATE-K SULFATE-MG SULF 17.5-3.13-1.6 GM/177ML PO SOLN: 1.0000 | Freq: Once | ORAL | 0 refills | Status: AC

## 2022-01-18 NOTE — Progress Notes (Signed)
No egg or soy allergy known to patient  No issues known to pt with past sedation with any surgeries or procedures Patient denies ever being told they had issues or difficulty with intubation  No FH of Malignant Hyperthermia Pt is not on diet pills Pt is not on  home 02  Pt is not on blood thinners  Pt denies issues with constipation  No A fib or A flutter Have any cardiac testing pending--yes Pt advised to get cardiac clearance sent to [place] prior to procedure Pt instructed to use Singlecare[employer]com or GoodRx for a price reduction on prep

## 2022-01-19 ENCOUNTER — Encounter: Payer: Self-pay | Admitting: Cardiology

## 2022-01-19 ENCOUNTER — Ambulatory Visit: Payer: 59 | Admitting: Cardiology

## 2022-01-19 VITALS — BP 121/73 | HR 71 | Temp 97.4°F | Resp 16 | Ht 65.0 in | Wt 178.2 lb

## 2022-01-19 DIAGNOSIS — R0609 Other forms of dyspnea: Secondary | ICD-10-CM

## 2022-01-19 DIAGNOSIS — R931 Abnormal findings on diagnostic imaging of heart and coronary circulation: Secondary | ICD-10-CM

## 2022-01-19 DIAGNOSIS — I10 Essential (primary) hypertension: Secondary | ICD-10-CM

## 2022-01-19 DIAGNOSIS — E7800 Pure hypercholesterolemia, unspecified: Secondary | ICD-10-CM

## 2022-01-19 DIAGNOSIS — Z8249 Family history of ischemic heart disease and other diseases of the circulatory system: Secondary | ICD-10-CM

## 2022-01-19 NOTE — Progress Notes (Signed)
ID:  Shelia Rush, DOB 02-Jan-1966, MRN [id number]  PCP:  Berkley Harvey, NP  Cardiologist:  Rex Kras, DO, [facility] (established care 10/11/2021)  Date: 01/19/22 Last Office Visit: 12/16/2021  Chief Complaint  Patient presents with   Shortness of Breath   Results   Follow-up   HPI  Shelia Rush is a 56 y.o. Caucasian female whose past medical history and cardiovascular risk factors include: Mild coronary artery calcification, aortic atherosclerosis, HTN, HLD, family hx of premature CAD.   Patient was referred to the practice for evaluation of tachycardia and palpitations along with shortness of breath.  She has undergone cardiovascular work-up including an echocardiogram, coronary calcium score.  Her symptoms of shortness of breath and tachycardia were likely secondary to uncontrolled hypertension.  After starting her on medical therapy her office blood pressures and home readings have improved significantly.  Given her cholesterol medications she was started on statin therapy by her PCP, of note her index LDL was 155 mg/dL.  At the last office visit both patient and her husband were concerned of possible coronary artery disease given her family history.  As a result of shared decision was to proceed with coronary CTA.  Patient presents today for follow-up.  Since last office visit her physical activity level continues to uptrend and she feels great.  Patient states that she did not realize how it feels to have her numbers well controlled and increasing her physical activity.  She denies anginal discomfort or heart failure symptoms.   FUNCTIONAL STATUS: Patient is started walking 1 mile a day and is quite motivated to increase her physical activity.  ALLERGIES: No Known Allergies  MEDICATION LIST PRIOR TO VISIT: Current Meds  Medication Sig   diphenhydramine-acetaminophen (TYLENOL PM) 25-500 MG TABS tablet Take 2 tablets by mouth at bedtime as needed.   hydrochlorothiazide  (MICROZIDE) 12.5 MG capsule TAKE 1 CAPSULE BY MOUTH EACH MORNING   losartan (COZAAR) 25 MG tablet TAKE 1 TABLET BY MOUTH DAILY AT TEN IN THE EVENING   metoprolol tartrate (LOPRESSOR) 25 MG tablet TAKE ONE TABLET BY MOUTH TWICE DAILY   pravastatin (PRAVACHOL) 20 MG tablet Take 1 tablet by mouth daily.     PAST MEDICAL HISTORY: Past Medical History:  Diagnosis Date   Hyperlipidemia    Hypertension     PAST SURGICAL HISTORY: Past Surgical History:  Procedure Laterality Date   [place]   FRACTURE SURGERY  2004   left hip    FAMILY HISTORY: The patient family history includes Diabetes in her father and mother; Heart failure in her father and mother; Hyperlipidemia in her father and mother; Hypertension in her father and mother; Mental illness in her mother; Stroke in her father.  SOCIAL HISTORY:  The patient  reports that she has never smoked. She has never used smokeless tobacco. She reports that she does not drink alcohol and does not use drugs.  REVIEW OF SYSTEMS: Review of Systems  Constitutional: Negative for malaise/fatigue.  Cardiovascular:  Negative for chest pain, dyspnea on exertion, leg swelling, near-syncope, orthopnea, palpitations, paroxysmal nocturnal dyspnea and syncope.  Respiratory:  Negative for shortness of breath.   Neurological:  Negative for dizziness, headaches and light-headedness.    PHYSICAL EXAM:    01/19/2022   10:19 AM 01/18/2022   10:22 AM 01/16/2022    8:57 AM  Vitals with BMI  Height '5\' 5"'$  '5\' 5"'$    Weight 178 lbs 3 oz 179 lbs 6 oz   BMI  78.93 81.01   Systolic 751  025  Diastolic 73  72  Pulse 71  67    CONSTITUTIONAL: Well-developed and well-nourished. No acute distress.  SKIN: Skin is warm and dry. No rash noted. No cyanosis. No pallor. No jaundice HEAD: Normocephalic and atraumatic.  EYES: No scleral icterus MOUTH/THROAT: Moist oral membranes.  NECK: No JVD present. No thyromegaly noted. No carotid bruits  CHEST Normal  respiratory effort. No intercostal retractions  LUNGS: Clear to auscultation bilaterally.  No stridor. No wheezes. No rales.  CARDIOVASCULAR: Regular rate and rhythm, positive S1-S2, no murmurs rubs or gallops appreciated. ABDOMINAL: Soft, nontender, nondistended, positive bowel sounds in all 4 quadrants, no apparent ascites.  EXTREMITIES: No peripheral edema, warm to touch, 2+ bilateral DP and PT pulses HEMATOLOGIC: No significant bruising NEUROLOGIC: Oriented to person, place, and time. Nonfocal. Normal muscle tone.  PSYCHIATRIC: Normal mood and affect. Normal behavior. Cooperative  CARDIAC DATABASE: EKG: 10/11/2021: Normal sinus rhythm, 87 bpm, nonspecific T wave abnormality, without underlying injury pattern.  Echocardiogram: 10/18/2021: Normal LV systolic function with visual EF 60-65%. Left ventricle cavity is normal in size. Normal left ventricular wall thickness. Normal global wall motion. Normal diastolic filling pattern, normal LAP. No significant valvular heart disease. No prior study for comparison.   Stress Testing: No results found for this or any previous visit from the past 1095 days.  Heart Catheterization: None  Coronary artery calcium score 11/10/2021: Total CAC 1, 68th percentile Aortic atherosclerosis.  Coronary CTA: January 16, 2022: 1. Total coronary calcium score of 1. This was 68th percentile for age and sex matched control.   2. Normal coronary origin with right dominance.   3. CAD-RADS = 2. Left Main: Patent. LAD: Mild stenosis (25-49%) at mid segment due to noncalcified plaque otherwise patent. LCx: Patent. RCA: Patent.   4.  Aortic atherosclerosis. No acute or significant extracardiac abnormalities.  Cardiac monitor (Zio Patch): December 16, 2021-December 30, 2021 Dominant rhythm sinus, followed by tachycardia (burden 15%). Heart rate 52-197 bpm. Avg HR 82 bpm. No atrial fibrillation, high grade AV block, pauses (3 seconds or longer). Asymptomatic  episode of NSVT (fastest event 5 beats, 2.6 seconds, average heart rate 153 bpm, max heart rate 179 bpm). Asymptomatic episodes of PSVT. Patient triggered events: 6. Underlying rhythm either sinus or sinus tachycardia without dysrhythmias.  LABORATORY DATA: External Labs: Collected: 09/09/2021. Hemoglobin 13.4 g/dL, hematocrit 39.8%. TSH is 0.91. Hemoglobin A1c 6. Total cholesterol 227, triglycerides 112, HDL 50, LDL 155, non-HDL 177. Sodium 138, potassium 3.8 Chloride 103, bicarb 22, BUN 12, creatinine 0.72. AST 22, ALT 19  IMPRESSION:    ICD-10-CM   1. Agatston CAC score, <100  R93.1     2. Dyspnea on exertion  R06.09     3. Benign hypertension  I10     4. Hypercholesteremia  E78.00     5. Family history of premature CAD  Z82.49        RECOMMENDATIONS: Shelia Rush is a 56 y.o. Caucasian female whose past medical history and cardiac risk factors include: Mild coronary artery calcification, aortic atherosclerosis, HTN, HLD, family hx of premature CAD.   Patient was referred to the office for evaluation of tachycardia/palpitations and shortness of breath.  It is felt that the shortness of breath was likely secondary to uncontrolled hypertension and with up titration of antihypertensive medications her shortness of breath has improved significantly.  Given her LDL level of 155 mg/dL, mild coronary artery calcification, family history of premature CAD, and estimated  10-year risk of ASCVD patient was started on cholesterol medication and wanted ischemic work-up to rule out CAD.  She has undergone a coronary CTA since last office visit which notes CAD RADS 2 disease burden with mild noncalcified plaque in the LAD distribution.  Images reviewed with both the patient and her husband at today's office visit and noted above for further reference.  The shared decision was to continue with cholesterol medication and add aspirin 81 mg p.o. daily.  Repeat labs from January 09, 2022  independently reviewed.  Patient is LDL levels are improving, triglycerides are not well controlled.  AST and ALT are within normal limits.  The shared decision was to continue lifestyle changes and we will reevaluate at the next visit.  Patient just had her medications refilled.  But when she is due for refills again we could consider transitioning her from Lopressor to Toprol-XL and combining ARB/HCTZ into 1 tablet for convenience.  This shared decision was to follow-up on an annual basis after her yearly well visit with PCP to evaluate her symptoms, risk factors, and labs.  Patient is planning to have a colonoscopy with Dr. Delila Pereyra, GI.  Patient is considered to be overall a low risk candidate.  She is asked to hold aspirin 81 mg 7 days prior to the procedure and restart as per Dr. Libby Maw recommendations after appropriate hemostasis is achieved.  FINAL MEDICATION LIST END OF ENCOUNTER: No orders of the defined types were placed in this encounter.   There are no discontinued medications.    Current Outpatient Medications:    diphenhydramine-acetaminophen (TYLENOL PM) 25-500 MG TABS tablet, Take 2 tablets by mouth at bedtime as needed., Disp: , Rfl:    hydrochlorothiazide (MICROZIDE) 12.5 MG capsule, TAKE 1 CAPSULE BY MOUTH EACH MORNING, Disp: 30 capsule, Rfl: 0   losartan (COZAAR) 25 MG tablet, TAKE 1 TABLET BY MOUTH DAILY AT TEN IN THE EVENING, Disp: 90 tablet, Rfl: 0   metoprolol tartrate (LOPRESSOR) 25 MG tablet, TAKE ONE TABLET BY MOUTH TWICE DAILY, Disp: 60 tablet, Rfl: 0   pravastatin (PRAVACHOL) 20 MG tablet, Take 1 tablet by mouth daily., Disp: , Rfl:   No orders of the defined types were placed in this encounter.   There are no Patient Instructions on file for this visit.   --Continue cardiac medications as reconciled in final medication list. --Return in about 1 year (around 01/20/2023) for Follow up cornary artery calcification and HLD. . Or sooner if  needed. --Continue follow-up with your primary care physician regarding the management of your other chronic comorbid conditions.  Patient's questions and concerns were addressed to her satisfaction. She voices understanding of the instructions provided during this encounter.   This note was created using a voice recognition software as a result there may be grammatical errors inadvertently enclosed that do not reflect the nature of this encounter. Every attempt is made to correct such errors.  Rex Kras, [place], [facility]  Pager: [phone] Office: [phone]

## 2022-02-09 ENCOUNTER — Other Ambulatory Visit: Payer: Self-pay | Admitting: Cardiology

## 2022-02-09 DIAGNOSIS — I10 Essential (primary) hypertension: Secondary | ICD-10-CM

## 2022-02-13 ENCOUNTER — Other Ambulatory Visit: Payer: Self-pay | Admitting: Cardiology

## 2022-02-13 DIAGNOSIS — R0609 Other forms of dyspnea: Secondary | ICD-10-CM

## 2022-02-13 DIAGNOSIS — R9431 Abnormal electrocardiogram [ECG] [EKG]: Secondary | ICD-10-CM

## 2022-02-13 DIAGNOSIS — R931 Abnormal findings on diagnostic imaging of heart and coronary circulation: Secondary | ICD-10-CM

## 2022-02-14 ENCOUNTER — Encounter: Payer: Self-pay | Admitting: Internal Medicine

## 2022-02-19 ENCOUNTER — Encounter: Payer: Self-pay | Admitting: Certified Registered Nurse Anesthetist

## 2022-02-21 ENCOUNTER — Telehealth: Payer: Self-pay | Admitting: Internal Medicine

## 2022-02-21 NOTE — Telephone Encounter (Signed)
New prep instructions sent to pt's mychart.

## 2022-02-21 NOTE — Telephone Encounter (Signed)
Patients called requesting to reschedule her procedure and she would like new prep instructions.

## 2022-02-22 ENCOUNTER — Encounter: Payer: 59 | Admitting: Internal Medicine

## 2022-03-02 ENCOUNTER — Encounter: Payer: Self-pay | Admitting: Internal Medicine

## 2022-03-02 ENCOUNTER — Ambulatory Visit (AMBULATORY_SURGERY_CENTER): Payer: 59 | Admitting: Internal Medicine

## 2022-03-02 VITALS — BP 120/65 | HR 61 | Temp 96.0°F | Resp 11 | Ht 65.0 in | Wt 179.0 lb

## 2022-03-02 DIAGNOSIS — Z86010 Personal history of colonic polyps: Secondary | ICD-10-CM | POA: Diagnosis not present

## 2022-03-02 DIAGNOSIS — D125 Benign neoplasm of sigmoid colon: Secondary | ICD-10-CM | POA: Diagnosis not present

## 2022-03-02 DIAGNOSIS — Z09 Encounter for follow-up examination after completed treatment for conditions other than malignant neoplasm: Secondary | ICD-10-CM

## 2022-03-02 DIAGNOSIS — D123 Benign neoplasm of transverse colon: Secondary | ICD-10-CM | POA: Diagnosis not present

## 2022-03-02 DIAGNOSIS — D122 Benign neoplasm of ascending colon: Secondary | ICD-10-CM | POA: Diagnosis not present

## 2022-03-02 DIAGNOSIS — K6289 Other specified diseases of anus and rectum: Secondary | ICD-10-CM

## 2022-03-02 MED ORDER — SODIUM CHLORIDE 0.9 % IV SOLN: 500.0000 mL | Freq: Once | INTRAVENOUS | Status: DC

## 2022-03-02 MED ORDER — HYDROCORTISONE (PERIANAL) 2.5 % EX CREA: 1.0000 | TOPICAL_CREAM | Freq: Two times a day (BID) | CUTANEOUS | 0 refills | Status: AC

## 2022-03-02 NOTE — Progress Notes (Signed)
Pt's states no medical or surgical changes since previsit or office visit. 

## 2022-03-02 NOTE — Progress Notes (Signed)
GASTROENTEROLOGY PROCEDURE H&P NOTE   Primary Care Physician: Berkley Harvey, NP    Reason for Procedure:   History of colon polyps  Plan:    Colonoscopy  Patient is appropriate for endoscopic procedure(s) in the ambulatory ([place]) setting.  The nature of the procedure, as well as the risks, benefits, and alternatives were carefully and thoroughly reviewed with the patient. Ample time for discussion and questions allowed. The patient understood, was satisfied, and agreed to proceed.     HPI: Shelia Rush is a 56 y.o. female who presents for colonoscopy for history of colon polyps. Denies blood in stools, changes in bowel habits, weight loss. Denies family history of colon cancer.  Past Medical History:  Diagnosis Date   Hyperlipidemia    Hypertension     Past Surgical History:  Procedure Laterality Date   CESAREAN SECTION  06/26/1996   COLONOSCOPY  2009   FRACTURE SURGERY  06/26/2002   left hip    Prior to Admission medications   Medication Sig Start Date End Date Taking? Authorizing Provider  diphenhydramine-acetaminophen (TYLENOL PM) 25-500 MG TABS tablet Take 2 tablets by mouth at bedtime as needed.   Yes [provider]  hydrochlorothiazide (MICROZIDE) 12.5 MG capsule TAKE 1 CAPSULE BY MOUTH EACH MORNING 02/09/22  Yes Tolia, Sunit, DO  losartan (COZAAR) 25 MG tablet TAKE 1 TABLET BY MOUTH DAILY AT TEN IN THE EVENING 02/09/22  Yes Tolia, Sunit, DO  metoprolol tartrate (LOPRESSOR) 25 MG tablet TAKE ONE TABLET BY MOUTH TWICE DAILY 02/13/22  Yes Tolia, Sunit, DO  pravastatin (PRAVACHOL) 20 MG tablet Take 1 tablet by mouth daily. 09/26/21  Yes [provider]    Current Outpatient Medications  Medication Sig Dispense Refill   diphenhydramine-acetaminophen (TYLENOL PM) 25-500 MG TABS tablet Take 2 tablets by mouth at bedtime as needed.     hydrochlorothiazide (MICROZIDE) 12.5 MG capsule TAKE 1 CAPSULE BY MOUTH EACH MORNING 30 capsule 0   losartan  (COZAAR) 25 MG tablet TAKE 1 TABLET BY MOUTH DAILY AT TEN IN THE EVENING 90 tablet 0   metoprolol tartrate (LOPRESSOR) 25 MG tablet TAKE ONE TABLET BY MOUTH TWICE DAILY 60 tablet 0   pravastatin (PRAVACHOL) 20 MG tablet Take 1 tablet by mouth daily.     Current Facility-Administered Medications  Medication Dose Route Frequency Provider Last Rate Last Admin   0.9 %  sodium chloride infusion  500 mL Intravenous Once Sharyn Creamer, MD        Allergies as of 03/02/2022   (No Known Allergies)    Family History  Problem Relation Age of Onset   Heart failure Mother    Diabetes Mother    Hyperlipidemia Mother    Hypertension Mother    Mental illness Mother    Heart failure Father    Diabetes Father    Hyperlipidemia Father    Hypertension Father    Stroke Father    Colon cancer Neg Hx    Esophageal cancer Neg Hx    Stomach cancer Neg Hx     Social History   Socioeconomic History   Marital status: Married    Spouse name: Not on file   Number of children: 1   Years of education: Not on file   Highest education level: Not on file  Occupational History   Not on file  Tobacco Use   Smoking status: Never   Smokeless tobacco: Never  Vaping Use   Vaping Use: Never used  Substance and  Sexual Activity   Alcohol use: No   Drug use: Never   Sexual activity: Yes    Birth control/protection: Post-menopausal  Other Topics Concern   Not on file  Social History Narrative   Not on file   Social Determinants of Health   Financial Resource Strain: Not on file  Food Insecurity: Not on file  Transportation Needs: Not on file  Physical Activity: Not on file  Stress: Not on file  Social Connections: Not on file  Intimate Partner Violence: Not on file    Physical Exam: Vital signs in last 24 hours: BP (!) 142/87   Pulse 70   Temp (!) 96 F (35.6 C) (Temporal)   Resp (!) 6   Ht '5\' 5"'$  (1.651 m)   Wt 179 lb (81.2 kg)   LMP 11/30/2013   SpO2 100%   BMI 29.79 kg/m  GEN:  NAD EYE: Sclerae anicteric ENT: MMM CV: Non-tachycardic Pulm: No increased work of breathing GI: Soft, NT/ND NEURO:  Alert & Oriented   [name], MD [facility]  03/02/2022 8:27 AM

## 2022-03-02 NOTE — Progress Notes (Signed)
Pt in recovery with monitors in place, VSS. Report given to receiving RN.  °

## 2022-03-02 NOTE — Progress Notes (Signed)
Called to room to assist during endoscopic procedure.  Patient ID and intended procedure confirmed with present staff. Received instructions for my participation in the procedure from the performing physician.  

## 2022-03-02 NOTE — Op Note (Addendum)
[place] Patient Name: Shelia Rush Procedure Date: 03/02/2022 8:04 AM MRN: [id number] Endoscopist: Sonny Masters "Shelia Rush ,  Age: 56 Referring MD:  Date of Birth: [birth year] Gender: Female Account #: [id number] Procedure:                Colonoscopy Indications:              High risk colon cancer surveillance: Personal                            history of colonic polyps Medicines:                Monitored Anesthesia Care Procedure:                Pre-Anesthesia Assessment:                           - Prior to the procedure, a History and Physical                            was performed, and patient medications and                            allergies were reviewed. The patient's tolerance of                            previous anesthesia was also reviewed. The risks                            and benefits of the procedure and the sedation                            options and risks were discussed with the patient.                            All questions were answered, and informed consent                            was obtained. Prior Anticoagulants: The patient has                            taken no previous anticoagulant or antiplatelet                            agents. ASA Grade Assessment: II - A patient with                            mild systemic disease. After reviewing the risks                            and benefits, the patient was deemed in                            satisfactory condition to undergo the procedure.  After obtaining informed consent, the colonoscope                            was passed under direct vision. Throughout the                            procedure, the patient's blood pressure, pulse, and                            oxygen saturations were monitored continuously. The                            Olympus PCF-H190DL (#[id number]) Colonoscope was                            introduced through the anus and  advanced to the the                            terminal ileum. The colonoscopy was somewhat                            difficult due to restricted mobility of the colon.                            Successful completion of the procedure was aided by                            changing the patient to a supine position and                            withdrawing the scope and replacing with the                            pediatric colonoscope. The patient tolerated the                            procedure well. The quality of the bowel                            preparation was good. The terminal ileum, ileocecal                            valve, appendiceal orifice, and rectum were                            photographed. Scope In: 8:38:35 AM Scope Out: 9:12:17 AM Scope Withdrawal Time: 0 hours 22 minutes 7 seconds  Total Procedure Duration: 0 hours 33 minutes 42 seconds  Findings:                 The terminal ileum appeared normal.                           A 2 mm polyp was found in the ascending colon. The  polyp was sessile. The polyp was removed with a                            cold biopsy forceps. Resection and retrieval were                            complete.                           Four sessile polyps were found in the transverse                            colon and ascending colon. The polyps were 3 to 4                            mm in size. These polyps were removed with a cold                            snare. Resection and retrieval were complete.                           A 2 mm polyp was found in the sigmoid colon. The                            polyp was sessile. The polyp was removed with a                            cold snare. Resection and retrieval were complete.                           Multiple diverticula were found in the sigmoid                            colon.                           Localized inflammation characterized by  erythema                            and friability was found in the rectum. Biopsies                            were taken with a cold forceps for histology.                           Non-bleeding internal hemorrhoids were found during                            retroflexion. Complications:            No immediate complications. Estimated Blood Loss:     Estimated blood loss was minimal. Impression:               - The examined portion of the ileum was normal.                           -  One 2 mm polyp in the ascending colon, removed                            with a cold biopsy forceps. Resected and retrieved.                           - Four 3 to 4 mm polyps in the transverse colon and                            in the ascending colon, removed with a cold snare.                            Resected and retrieved.                           - One 2 mm polyp in the sigmoid colon, removed with                            a cold snare. Resected and retrieved.                           - Diverticulosis in the sigmoid colon.                           - Localized inflammation was found in the rectum.                            Biopsied.                           - Non-bleeding internal hemorrhoids. Recommendation:           - Discharge patient to home (with escort).                           - Await pathology results.                           - Anusol HC cream BID for 7 days.                           - Contact us if you are interested in hemorrhoidal                            banding in the future                           - The findings and recommendations were discussed                            with the patient. Dr Georgian Co "Shelia Rush" Shelia Rush,  03/02/2022 9:23:20 AM

## 2022-03-02 NOTE — Patient Instructions (Addendum)
Information on polyps and diverticulosis given to you today.Marland Kitchen  Await pathology results.  Resume previous diet and medications.   YOU HAD AN ENDOSCOPIC PROCEDURE TODAY AT [facility]:   Refer to the procedure report that was given to you for any specific questions about what was found during the examination.  If the procedure report does not answer your questions, please call your gastroenterologist to clarify.  If you requested that your care partner not be given the details of your procedure findings, then the procedure report has been included in a sealed envelope for you to review at your convenience later.  YOU SHOULD EXPECT: Some feelings of bloating in the abdomen. Passage of more gas than usual.  Walking can help get rid of the air that was put into your GI tract during the procedure and reduce the bloating. If you had a lower endoscopy (such as a colonoscopy or flexible sigmoidoscopy) you may notice spotting of blood in your stool or on the toilet paper. If you underwent a bowel prep for your procedure, you may not have a normal bowel movement for a few days.  Please Note:  You might notice some irritation and congestion in your nose or some drainage.  This is from the oxygen used during your procedure.  There is no need for concern and it should clear up in a day or so.  SYMPTOMS TO REPORT IMMEDIATELY:  Following lower endoscopy (colonoscopy or flexible sigmoidoscopy):  Excessive amounts of blood in the stool  Significant tenderness or worsening of abdominal pains  Swelling of the abdomen that is new, acute  Fever of 100F or higher   For urgent or emergent issues, a gastroenterologist can be reached at any hour by calling [phone]. Do not use MyChart messaging for urgent concerns.    DIET:  We do recommend a small meal at first, but then you may proceed to your regular diet.  Drink plenty of fluids but you should avoid alcoholic beverages for 24  hours.  ACTIVITY:  You should plan to take it easy for the rest of today and you should NOT DRIVE or use heavy machinery until tomorrow (because of the sedation medicines used during the test).    FOLLOW UP: Our staff will call the number listed on your records the next business day following your procedure.  We will call around 7:15- 8:00 am to check on you and address any questions or concerns that you may have regarding the information given to you following your procedure. If we do not reach you, we will leave a message.  If you develop any symptoms (ie: fever, flu-like symptoms, shortness of breath, cough etc.) before then, please call [phone].  If you test positive for Covid 19 in the 2 weeks post procedure, please call and report this information to [place].    If any biopsies were taken you will be contacted by phone or by letter within the next 1-3 weeks.  Please call us at [phone] if you have not heard about the biopsies in 3 weeks.    SIGNATURES/CONFIDENTIALITY: You and/or your care partner have signed paperwork which will be entered into your electronic medical record.  These signatures attest to the fact that that the information above on your After Visit Summary has been reviewed and is understood.  Full responsibility of the confidentiality of this discharge information lies with you and/or your care-partner.

## 2022-03-02 NOTE — Progress Notes (Signed)
0826: MD arrived for time out

## 2022-03-03 ENCOUNTER — Telehealth: Payer: Self-pay

## 2022-03-03 NOTE — Telephone Encounter (Signed)
  Follow up Call-     03/02/2022    7:09 AM  Call back number  Post procedure Call Back phone  # [phone]  Permission to leave phone message Yes     Patient questions:  Do you have a fever, pain , or abdominal swelling? No. Pain Score  0 *  Have you tolerated food without any problems? Yes.    Have you been able to return to your normal activities? Yes.    Do you have any questions about your discharge instructions: Diet   No. Medications  No. Follow up visit  No.  Do you have questions or concerns about your Care? No.  Actions: * If pain score is 4 or above: No action needed, pain <4.

## 2022-03-07 ENCOUNTER — Encounter: Payer: Self-pay | Admitting: Internal Medicine

## 2022-03-13 ENCOUNTER — Other Ambulatory Visit: Payer: Self-pay | Admitting: Cardiology

## 2022-03-13 DIAGNOSIS — I10 Essential (primary) hypertension: Secondary | ICD-10-CM

## 2022-03-20 ENCOUNTER — Other Ambulatory Visit: Payer: Self-pay | Admitting: Cardiology

## 2022-03-20 DIAGNOSIS — R9431 Abnormal electrocardiogram [ECG] [EKG]: Secondary | ICD-10-CM

## 2022-03-20 DIAGNOSIS — R0609 Other forms of dyspnea: Secondary | ICD-10-CM

## 2022-03-20 DIAGNOSIS — R931 Abnormal findings on diagnostic imaging of heart and coronary circulation: Secondary | ICD-10-CM

## 2022-04-11 ENCOUNTER — Other Ambulatory Visit: Payer: Self-pay | Admitting: Cardiology

## 2022-04-11 DIAGNOSIS — I10 Essential (primary) hypertension: Secondary | ICD-10-CM

## 2022-04-25 ENCOUNTER — Other Ambulatory Visit: Payer: Self-pay | Admitting: Cardiology

## 2022-04-25 DIAGNOSIS — R0609 Other forms of dyspnea: Secondary | ICD-10-CM

## 2022-04-25 DIAGNOSIS — R9431 Abnormal electrocardiogram [ECG] [EKG]: Secondary | ICD-10-CM

## 2022-04-25 DIAGNOSIS — R931 Abnormal findings on diagnostic imaging of heart and coronary circulation: Secondary | ICD-10-CM

## 2022-05-08 ENCOUNTER — Other Ambulatory Visit: Payer: Self-pay | Admitting: Cardiology

## 2022-05-08 DIAGNOSIS — I10 Essential (primary) hypertension: Secondary | ICD-10-CM

## 2022-05-29 ENCOUNTER — Other Ambulatory Visit: Payer: Self-pay | Admitting: Cardiology

## 2022-05-29 DIAGNOSIS — R931 Abnormal findings on diagnostic imaging of heart and coronary circulation: Secondary | ICD-10-CM

## 2022-05-29 DIAGNOSIS — R0609 Other forms of dyspnea: Secondary | ICD-10-CM

## 2022-05-29 DIAGNOSIS — R9431 Abnormal electrocardiogram [ECG] [EKG]: Secondary | ICD-10-CM

## 2022-06-27 ENCOUNTER — Other Ambulatory Visit: Payer: Self-pay | Admitting: Cardiology

## 2022-06-27 DIAGNOSIS — R931 Abnormal findings on diagnostic imaging of heart and coronary circulation: Secondary | ICD-10-CM

## 2022-06-27 DIAGNOSIS — R0609 Other forms of dyspnea: Secondary | ICD-10-CM

## 2022-06-27 DIAGNOSIS — R9431 Abnormal electrocardiogram [ECG] [EKG]: Secondary | ICD-10-CM

## 2022-07-25 ENCOUNTER — Other Ambulatory Visit: Payer: Self-pay | Admitting: Cardiology

## 2022-07-25 DIAGNOSIS — R9431 Abnormal electrocardiogram [ECG] [EKG]: Secondary | ICD-10-CM

## 2022-07-25 DIAGNOSIS — R931 Abnormal findings on diagnostic imaging of heart and coronary circulation: Secondary | ICD-10-CM

## 2022-07-25 DIAGNOSIS — R0609 Other forms of dyspnea: Secondary | ICD-10-CM

## 2022-08-11 ENCOUNTER — Other Ambulatory Visit: Payer: Self-pay | Admitting: Cardiology

## 2022-08-11 DIAGNOSIS — I10 Essential (primary) hypertension: Secondary | ICD-10-CM

## 2022-08-21 ENCOUNTER — Other Ambulatory Visit: Payer: Self-pay | Admitting: Cardiology

## 2022-08-21 DIAGNOSIS — R0609 Other forms of dyspnea: Secondary | ICD-10-CM

## 2022-08-21 DIAGNOSIS — R9431 Abnormal electrocardiogram [ECG] [EKG]: Secondary | ICD-10-CM

## 2022-08-21 DIAGNOSIS — I10 Essential (primary) hypertension: Secondary | ICD-10-CM

## 2022-08-21 DIAGNOSIS — R931 Abnormal findings on diagnostic imaging of heart and coronary circulation: Secondary | ICD-10-CM

## 2022-09-20 ENCOUNTER — Ambulatory Visit: Payer: 59 | Admitting: Nurse Practitioner

## 2022-09-20 ENCOUNTER — Encounter: Payer: Self-pay | Admitting: Nurse Practitioner

## 2022-09-20 VITALS — BP 116/68 | HR 69 | Temp 99.0°F | Ht 65.0 in | Wt 177.8 lb

## 2022-09-20 DIAGNOSIS — Z1329 Encounter for screening for other suspected endocrine disorder: Secondary | ICD-10-CM | POA: Diagnosis not present

## 2022-09-20 DIAGNOSIS — Z0000 Encounter for general adult medical examination without abnormal findings: Secondary | ICD-10-CM | POA: Insufficient documentation

## 2022-09-20 DIAGNOSIS — I10 Essential (primary) hypertension: Secondary | ICD-10-CM | POA: Diagnosis not present

## 2022-09-20 DIAGNOSIS — E7800 Pure hypercholesterolemia, unspecified: Secondary | ICD-10-CM | POA: Insufficient documentation

## 2022-09-20 LAB — CBC WITH DIFFERENTIAL/PLATELET
Basophils Absolute: 0 10*3/uL (ref 0.0–0.1)
Basophils Relative: 0.4 % (ref 0.0–3.0)
Eosinophils Absolute: 0.1 10*3/uL (ref 0.0–0.7)
Eosinophils Relative: 0.8 % (ref 0.0–5.0)
HCT: 38.9 % (ref 36.0–46.0)
Hemoglobin: 13.3 g/dL (ref 12.0–15.0)
Lymphocytes Relative: 35.5 % (ref 12.0–46.0)
Lymphs Abs: 3.2 10*3/uL (ref 0.7–4.0)
MCHC: 34.1 g/dL (ref 30.0–36.0)
MCV: 91.5 fl (ref 78.0–100.0)
Monocytes Absolute: 0.6 10*3/uL (ref 0.1–1.0)
Monocytes Relative: 6.3 % (ref 3.0–12.0)
Neutro Abs: 5.1 10*3/uL (ref 1.4–7.7)
Neutrophils Relative %: 57 % (ref 43.0–77.0)
Platelets: 367 10*3/uL (ref 150.0–400.0)
RBC: 4.26 Mil/uL (ref 3.87–5.11)
RDW: 13 % (ref 11.5–15.5)
WBC: 8.9 10*3/uL (ref 4.0–10.5)

## 2022-09-20 LAB — TSH: TSH: 1.32 u[IU]/mL (ref 0.35–5.50)

## 2022-09-20 LAB — COMPREHENSIVE METABOLIC PANEL
ALT: 21 U/L (ref 0–35)
AST: 23 U/L (ref 0–37)
Albumin: 4.8 g/dL (ref 3.5–5.2)
Alkaline Phosphatase: 61 U/L (ref 39–117)
BUN: 11 mg/dL (ref 6–23)
CO2: 30 mEq/L (ref 19–32)
Calcium: 9.9 mg/dL (ref 8.4–10.5)
Chloride: 98 mEq/L (ref 96–112)
Creatinine, Ser: 0.79 mg/dL (ref 0.40–1.20)
GFR: 82.07 mL/min (ref >60.00)
Glucose, Bld: 113 mg/dL — ABNORMAL HIGH (ref 70–99)
Potassium: 3.7 mEq/L (ref 3.5–5.1)
Sodium: 138 mEq/L (ref 135–145)
Total Bilirubin: 0.5 mg/dL (ref 0.2–1.2)
Total Protein: 8 g/dL (ref 6.0–8.3)

## 2022-09-20 LAB — LIPID PANEL
Cholesterol: 198 mg/dL (ref 0–200)
HDL: 53 mg/dL (ref >39.00)
LDL Cholesterol: 118 mg/dL — ABNORMAL HIGH (ref 0–99)
NonHDL: 144.93
Total CHOL/HDL Ratio: 4
Triglycerides: 136 mg/dL (ref 0.0–149.0)
VLDL: 27.2 mg/dL (ref 0.0–40.0)

## 2022-09-20 LAB — HEMOGLOBIN A1C: Hgb A1c MFr Bld: 6.1 % (ref 4.6–6.5)

## 2022-09-20 LAB — VITAMIN D 25 HYDROXY (VIT D DEFICIENCY, FRACTURES): VITD: 7.06 ng/mL — ABNORMAL LOW (ref 30.00–100.00)

## 2022-09-20 MED ORDER — METOPROLOL TARTRATE 25 MG PO TABS: 25.0000 mg | ORAL_TABLET | Freq: Two times a day (BID) | ORAL | 3 refills | Status: DC

## 2022-09-20 MED ORDER — HYDROCHLOROTHIAZIDE 12.5 MG PO CAPS: ORAL_CAPSULE | ORAL | 3 refills | Status: DC

## 2022-09-20 MED ORDER — LOSARTAN POTASSIUM 25 MG PO TABS: ORAL_TABLET | ORAL | 3 refills | Status: DC

## 2022-09-20 MED ORDER — PRAVASTATIN SODIUM 20 MG PO TABS: 20.0000 mg | ORAL_TABLET | Freq: Every day | ORAL | 3 refills | Status: DC

## 2022-09-20 NOTE — Progress Notes (Signed)
Tomasita Morrow, NP-C Phone: [phone]  Shelia Rush is a 57 y.o. female who presents today to establish care and for annual exam. She has no complaints or new concerns today. She is doing well on all of her medications and requesting refills.    HYPERTENSION Disease Monitoring Home BP Monitoring- Not checking Chest pain- No    Dyspnea- No Medications Compliance-  Lopressor, Losartan, HCTZ. Lightheadedness-  No  Edema- No BMET    Component Value Date/Time   NA 141 01/09/2022 0937   K 3.8 01/09/2022 0937   CL 102 01/09/2022 0937   CO2 24 01/09/2022 0937   GLUCOSE 109 (H) 01/09/2022 0937   GLUCOSE 100 (H) 05/26/2018 1330   BUN 13 01/09/2022 0937   CREATININE 0.84 01/09/2022 0937   CALCIUM 9.3 01/09/2022 0937   GFRNONAA >60 05/26/2018 1330   GFRAA >60 05/26/2018 1330   HYPERLIPIDEMIA Symptoms Chest pain on exertion:  No   Leg claudication:   No Medications: Compliance- Pravastatin Right upper quadrant pain- No  Muscle aches- No Lipid Panel     Component Value Date/Time   CHOL 173 01/09/2022 0937   TRIG 182 (H) 01/09/2022 0937   HDL 38 (L) 01/09/2022 0937   [place] 103 (H) 01/09/2022 0937   LDLDIRECT 106 (H) 01/09/2022 0937   LABVLDL 32 01/09/2022 0937   Diet: Eats whatever she wants, snacks/grazes a lot throughout the day, no caffeine Exercise: Walks a mile per day on treadmill Pap smear: 09/09/2021 Colonoscopy: 03/02/2022 Mammogram: 09/27/2021 Family history-  Colon cancer: No  Breast cancer: Yes- maternal aunts and cousins  Ovarian cancer: Yes- maternal aunts and cousins Menses: Postmenopausal Sexually active: Yes Vaccines-   Flu: UTD- 2023  Tetanus: 09/09/2021  Shingles: Completed  COVID19: x 4 HIV screening: Deferred Hep C Screening: Negative Tobacco use: No Alcohol use: No Illicit Drug use: No Dentist: No Ophthalmology: Yes   Active Ambulatory Problems    Diagnosis Date Noted   Pure hypercholesterolemia 09/20/2022   Primary hypertension  09/20/2022   Preventative health care 09/20/2022   Resolved Ambulatory Problems    Diagnosis Date Noted   No Resolved Ambulatory Problems   Past Medical History:  Diagnosis Date   Hyperlipidemia    Hypertension     Family History  Problem Relation Age of Onset   Heart failure Mother    Diabetes Mother    Hyperlipidemia Mother    Hypertension Mother    Mental illness Mother    Heart failure Father    Diabetes Father    Hyperlipidemia Father    Hypertension Father    Stroke Father    Colon cancer Neg Hx    Esophageal cancer Neg Hx    Stomach cancer Neg Hx     Social History   Socioeconomic History   Marital status: Married    Spouse name: Not on file   Number of children: 1   Years of education: Not on file   Highest education level: Not on file  Occupational History   Not on file  Tobacco Use   Smoking status: Never   Smokeless tobacco: Never  Vaping Use   Vaping Use: Never used  Substance and Sexual Activity   Alcohol use: No   Drug use: Never   Sexual activity: Yes    Birth control/protection: Post-menopausal  Other Topics Concern   Not on file  Social History Narrative   Not on file   Social Determinants of Health   Financial Resource Strain: Not on file  Food Insecurity: Not on file  Transportation Needs: Not on file  Physical Activity: Not on file  Stress: Not on file  Social Connections: Not on file  Intimate Partner Violence: Not on file    ROS  General:  Negative for unexplained weight loss, fever Skin: Negative for new or changing mole, sore that won't heal HEENT: Negative for trouble hearing, trouble seeing, ringing in ears, mouth sores, hoarseness, change in voice, dysphagia. CV:  Negative for chest pain, dyspnea, edema, palpitations Resp: Negative for cough, dyspnea, hemoptysis GI: Negative for nausea, vomiting, diarrhea, constipation, abdominal pain, melena, hematochezia. GU: Negative for dysuria, incontinence, urinary hesitance,  hematuria, vaginal or penile discharge, polyuria, sexual difficulty, lumps in testicle or breasts MSK: Negative for muscle cramps or aches, joint pain or swelling Neuro: Negative for headaches, weakness, numbness, dizziness, passing out/fainting Psych: Negative for depression, anxiety, memory problems  Objective  Physical Exam Vitals:   09/20/22 1050  BP: 116/68  Pulse: 69  Temp: 99 F (37.2 C)  SpO2: 98%    BP Readings from Last 3 Encounters:  09/20/22 116/68  03/02/22 120/65  01/19/22 121/73   Wt Readings from Last 3 Encounters:  09/20/22 177 lb 12.8 oz (80.6 kg)  03/02/22 179 lb (81.2 kg)  01/19/22 178 lb 3.2 oz (80.8 kg)    Physical Exam Constitutional:      General: She is not in acute distress.    Appearance: Normal appearance.  HENT:     Head: Normocephalic.     Right Ear: Tympanic membrane normal.     Left Ear: Tympanic membrane normal.     Nose: Nose normal.     Mouth/Throat:     Mouth: Mucous membranes are moist.     Pharynx: Oropharynx is clear.  Eyes:     Conjunctiva/sclera: Conjunctivae normal.     Pupils: Pupils are equal, round, and reactive to light.  Neck:     Thyroid: No thyromegaly.  Cardiovascular:     Rate and Rhythm: Normal rate and regular rhythm.     Heart sounds: Normal heart sounds.  Pulmonary:     Effort: Pulmonary effort is normal.     Breath sounds: Normal breath sounds.  Abdominal:     General: Abdomen is flat. Bowel sounds are normal.     Palpations: Abdomen is soft. There is no mass.     Tenderness: There is no abdominal tenderness.  Musculoskeletal:        General: Normal range of motion.  Lymphadenopathy:     Cervical: No cervical adenopathy.  Skin:    General: Skin is warm and dry.     Findings: No rash.  Neurological:     General: No focal deficit present.     Mental Status: She is alert.  Psychiatric:        Mood and Affect: Mood normal.        Behavior: Behavior normal.    Assessment/Plan:   Preventative  health care Assessment & Plan: Physical exam complete. Lab work as outlined. Will contact patient with results. Pap- UTD. Colonoscopy- UTD. Mammo- UTD. Flu, COVID and Tetanus vaccines- UTD. Shingles vaccine- Completed. Recommended establishing with Dentist and follow up with Ophthalmology for annual exams. Encouraged healthy diet and to continue exercising.   Orders: -     CBC with Differential/Platelet -     Comprehensive metabolic panel -     Hemoglobin A1c -     VITAMIN D 25 Hydroxy (Vit-D Deficiency, Fractures)  Primary hypertension Assessment &  Plan: Chronic. Stable on Lopressor, Losartan and HCTZ. Continue. Refills sent. DASH diet information provided to patient.   Orders: -     CBC with Differential/Platelet -     Comprehensive metabolic panel -     Metoprolol Tartrate; Take 1 tablet (25 mg total) by mouth 2 (two) times daily.  Dispense: 180 tablet; Refill: 3 -     Losartan Potassium; TAKE 1 TABLET BY MOUTH DAILY  Dispense: 90 tablet; Refill: 3 -     hydroCHLOROthiazide; TAKE 1 CAPSULE BY MOUTH EACH MORNING  Dispense: 90 capsule; Refill: 3  Pure hypercholesterolemia Assessment & Plan: Chronic. Stable on Pravastatin daily. Continue. Refills sent. Encouraged healthy diet and exercise.   Orders: -     Lipid panel -     Pravastatin Sodium; Take 1 tablet (20 mg total) by mouth daily. After 6 pm  Dispense: 90 tablet; Refill: 3  Thyroid disorder screen -     TSH   Return in about 6 months (around 03/23/2023) for Follow up.   Tomasita Morrow, NP-C [place]

## 2022-09-20 NOTE — Assessment & Plan Note (Signed)
Physical exam complete. Lab work as outlined. Will contact patient with results. Pap- UTD. Colonoscopy- UTD. Mammo- UTD. Flu, COVID and Tetanus vaccines- UTD. Shingles vaccine- Completed. Recommended establishing with Dentist and follow up with Ophthalmology for annual exams. Encouraged healthy diet and to continue exercising.

## 2022-09-20 NOTE — Assessment & Plan Note (Addendum)
Chronic. Stable on Lopressor, Losartan and HCTZ. Continue. Refills sent. DASH diet information provided to patient.

## 2022-09-20 NOTE — Assessment & Plan Note (Signed)
Chronic. Stable on Pravastatin daily. Continue. Refills sent. Encouraged healthy diet and exercise.

## 2022-09-21 ENCOUNTER — Other Ambulatory Visit: Payer: Self-pay | Admitting: Nurse Practitioner

## 2022-09-21 DIAGNOSIS — R7303 Prediabetes: Secondary | ICD-10-CM

## 2022-09-21 DIAGNOSIS — E559 Vitamin D deficiency, unspecified: Secondary | ICD-10-CM | POA: Insufficient documentation

## 2022-09-21 MED ORDER — VITAMIN D (ERGOCALCIFEROL) 1.25 MG (50000 UNIT) PO CAPS: 50000.0000 [IU] | ORAL_CAPSULE | ORAL | 1 refills | Status: DC

## 2022-09-21 NOTE — Assessment & Plan Note (Signed)
New. Rx for Vitamin D 50,000 units once a week sent to pharmacy. Will monitor.

## 2022-09-21 NOTE — Assessment & Plan Note (Signed)
New. A1c- 6.1, encouraged healthy diet and exercise. Will monitor.

## 2022-11-13 LAB — HM MAMMOGRAPHY

## 2023-01-18 ENCOUNTER — Ambulatory Visit: Payer: 59 | Admitting: Cardiology

## 2023-02-08 ENCOUNTER — Encounter (INDEPENDENT_AMBULATORY_CARE_PROVIDER_SITE_OTHER): Payer: Self-pay

## 2023-02-26 IMAGING — CT CT CARDIAC CORONARY ARTERY CALCIUM SCORE
3 series · 14 of 20 positions shown, 16 images · non-contrast
Comparison: None Available.

CLINICAL DATA: 56-year-old Caucasian female with history of
hyperlipidemia and family history of premature coronary artery
disease.

EXAM:
CT CARDIAC CORONARY ARTERY CALCIUM SCORE
TECHNIQUE: Non-contrast imaging through the heart was performed using
prospective ECG gating. Image post processing was performed on an
independent workstation, allowing for quantitative analysis of the
heart and coronary arteries. Note that this exam targets the heart
and the chest was not imaged in its entirety.

[Series 2: calcium scoring 2.00 qr36 bestdiast 70% hrt calciu · axial · 0.38mm/px · z∈[+1586,+1664]mm · 4 of 66 slices shown]
[im 14/66  vessel]
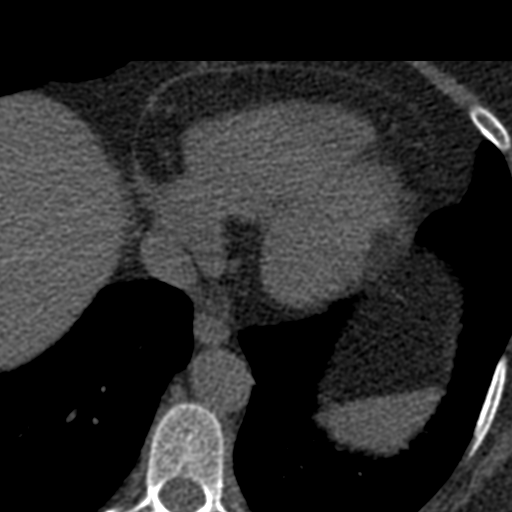
[im 27/66  vessel]
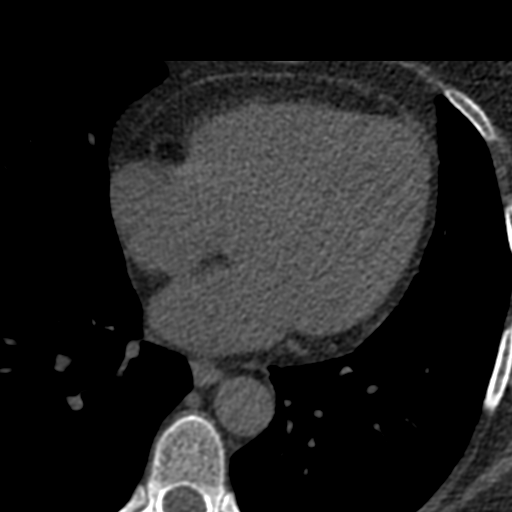
[im 40/66  vessel]
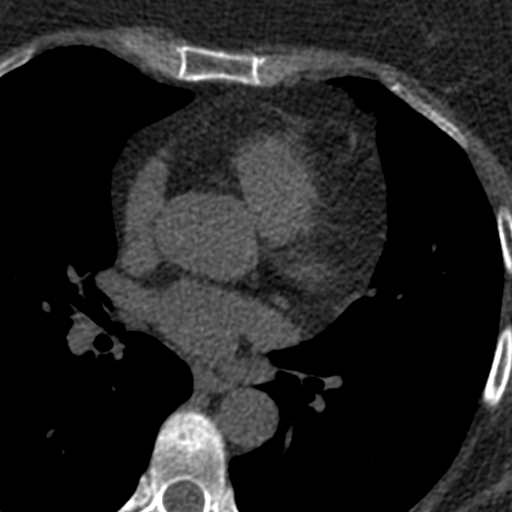
[im 53/66  vessel]
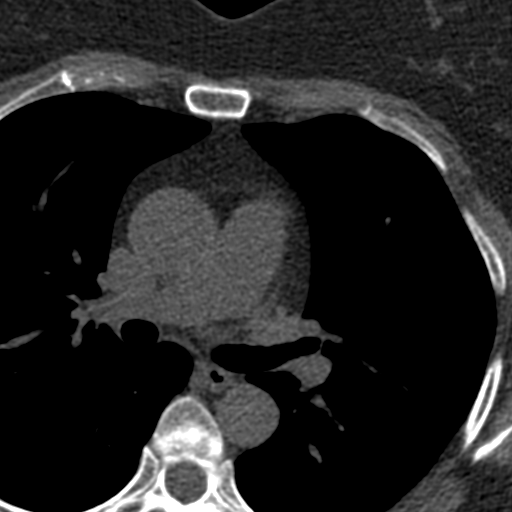

[Series 3: calcium scoring 2.00 br40 bestdiast 70% axial · axial · 0.60mm/px · z∈[+1584,+1668]mm · 5 of 64 slices shown, 7 images]
[im 11/64  vessel]
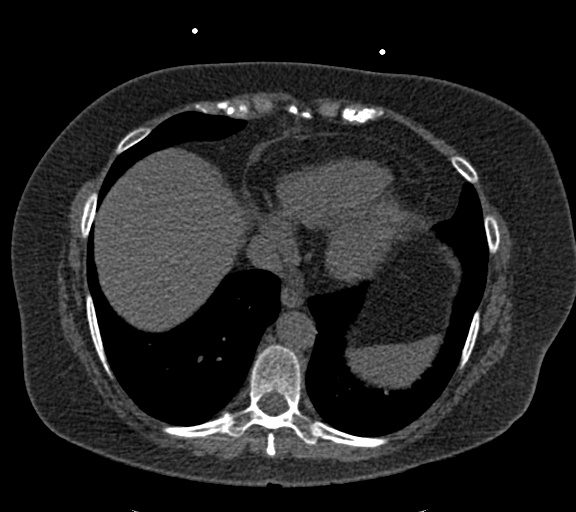
[im 11/64  lung]
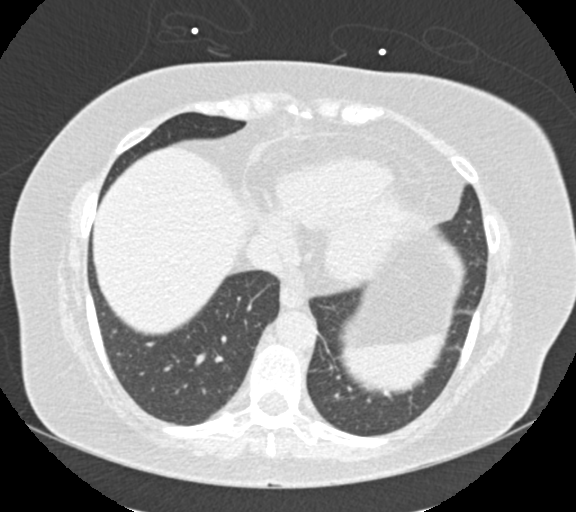
[im 22/64  vessel]
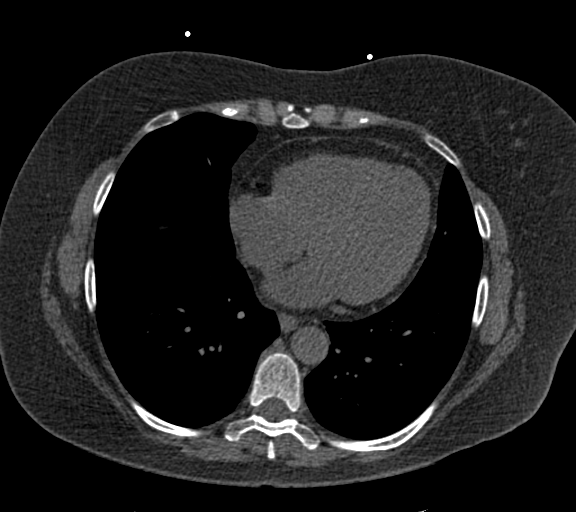
[im 32/64  vessel]
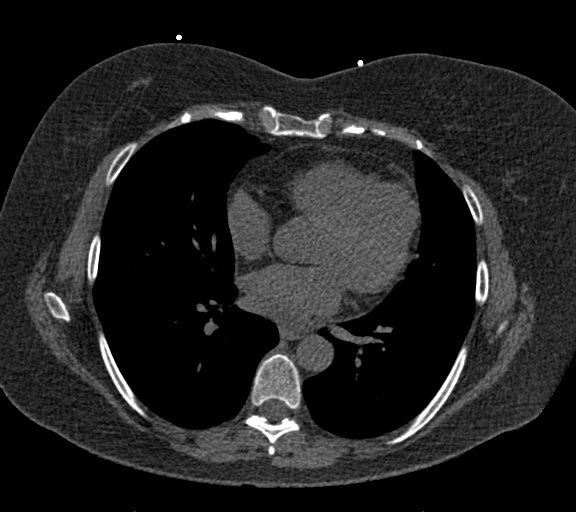
[im 43/64  vessel]
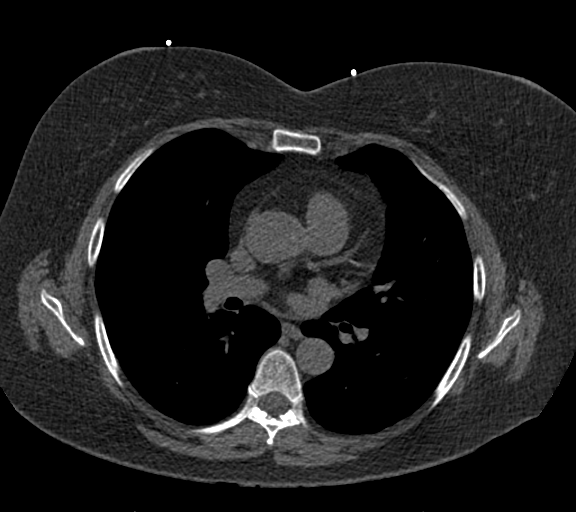
[im 53/64  vessel]
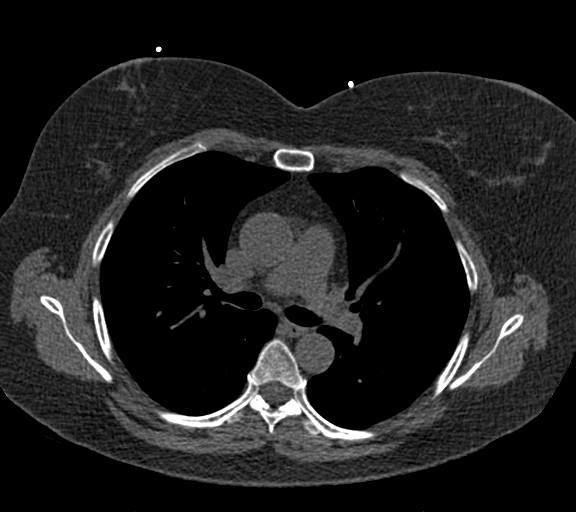
[im 53/64  lung]
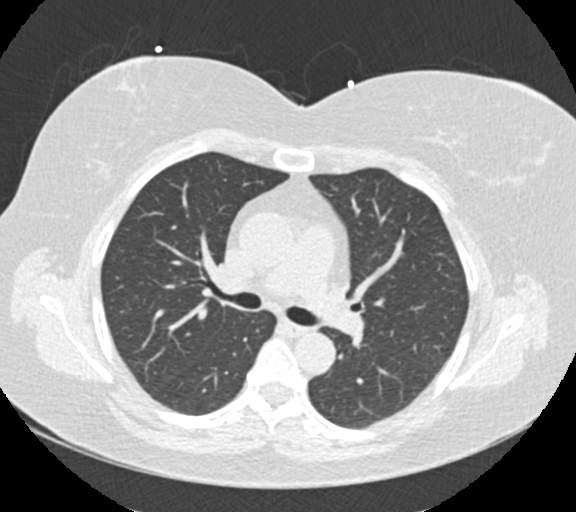

[Series 9: calcium scoring 2.00 br60 bestdiast 70% lungs · axial · 0.60mm/px · z∈[+1584,+1668]mm · 5 of 64 slices shown]
[im 11/64  vessel]
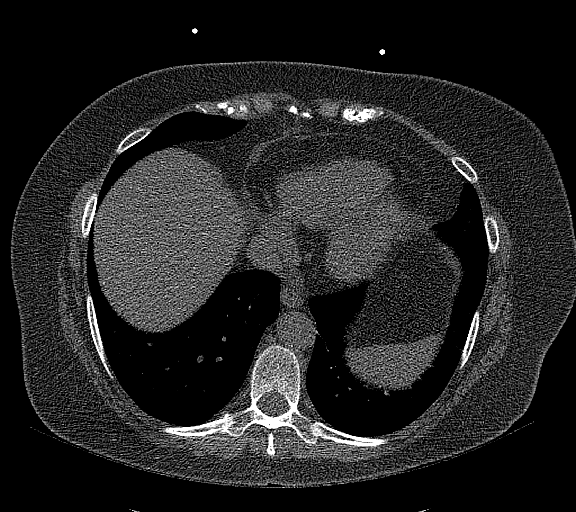
[im 22/64  vessel]
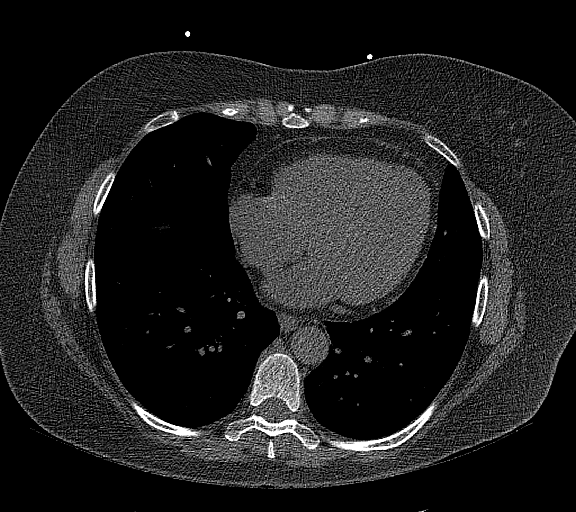
[im 32/64  vessel]
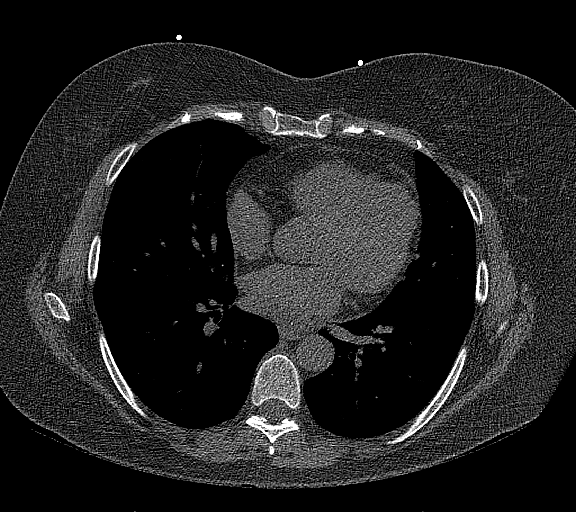
[im 43/64  vessel]
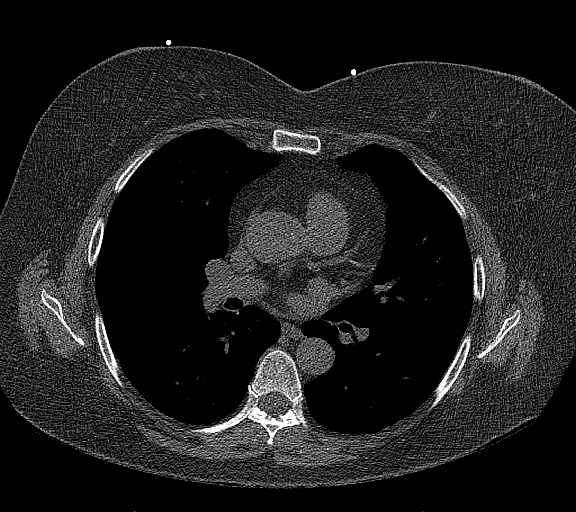
[im 53/64  vessel]
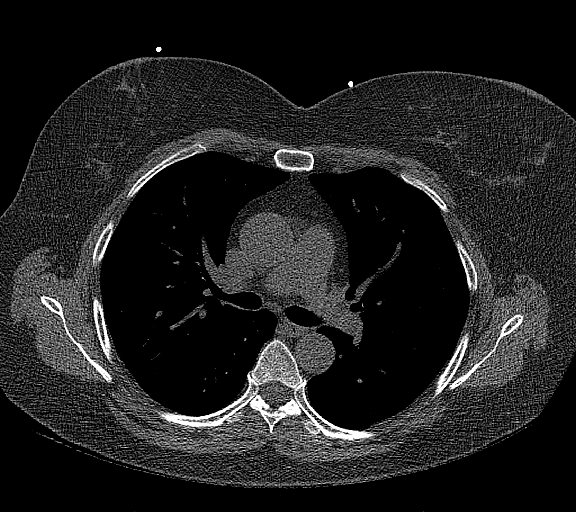

[14 of 20 positions shown; findings below may reference images not displayed]

FINDINGS: CORONARY CALCIUM SCORES:

Left Main: 0

LAD: 1

LCx: 0

RCA: 0

Total Agatston Score: 1

[HOSPITAL] percentile: 68th

AORTA MEASUREMENTS:

Ascending Aorta: 32 mm

Descending Aorta: 22 mm

EXTRACARDIAC FINDINGS:

Atherosclerotic calcifications are noted in the thoracic aorta.
Within the visualized portions of the thorax there are no suspicious
appearing pulmonary nodules or masses, there is no acute
consolidative airspace disease, no pleural effusions, no
pneumothorax and no lymphadenopathy. Visualized portions of the
upper abdomen are unremarkable. There are no aggressive appearing
lytic or blastic lesions noted in the visualized portions of the
skeleton.
IMPRESSION: 1. Patient's total coronary artery calcium score is 1 which is 68th
percentile for patient's of matched age, gender and race/ethnicity.
Please note that although the presence of coronary artery calcium
documents the presence of coronary artery disease, the severity of
this disease and any potential stenosis cannot be assessed on this
noncontrast CT examination. Assessment for potential risk factor
modification, dietary therapy or pharmacologic therapy may be
warranted, if clinically indicated.
2.  Aortic Atherosclerosis ([id number]).

## 2023-03-23 ENCOUNTER — Ambulatory Visit: Payer: 59 | Admitting: Nurse Practitioner

## 2023-03-27 ENCOUNTER — Ambulatory Visit: Payer: 59 | Admitting: Nurse Practitioner

## 2023-03-27 ENCOUNTER — Encounter: Payer: Self-pay | Admitting: Nurse Practitioner

## 2023-03-27 VITALS — BP 124/86 | HR 61 | Temp 98.0°F | Ht 65.0 in | Wt 179.8 lb

## 2023-03-27 DIAGNOSIS — Z23 Encounter for immunization: Secondary | ICD-10-CM | POA: Diagnosis not present

## 2023-03-27 DIAGNOSIS — E559 Vitamin D deficiency, unspecified: Secondary | ICD-10-CM

## 2023-03-27 DIAGNOSIS — R7303 Prediabetes: Secondary | ICD-10-CM

## 2023-03-27 DIAGNOSIS — L659 Nonscarring hair loss, unspecified: Secondary | ICD-10-CM | POA: Diagnosis not present

## 2023-03-27 DIAGNOSIS — E7800 Pure hypercholesterolemia, unspecified: Secondary | ICD-10-CM

## 2023-03-27 DIAGNOSIS — I10 Essential (primary) hypertension: Secondary | ICD-10-CM | POA: Diagnosis not present

## 2023-03-27 LAB — COMPREHENSIVE METABOLIC PANEL
ALT: 17 U/L (ref 0–35)
AST: 20 U/L (ref 0–37)
Albumin: 4.4 g/dL (ref 3.5–5.2)
Alkaline Phosphatase: 51 U/L (ref 39–117)
BUN: 11 mg/dL (ref 6–23)
CO2: 30 meq/L (ref 19–32)
Calcium: 9.6 mg/dL (ref 8.4–10.5)
Chloride: 100 meq/L (ref 96–112)
Creatinine, Ser: 0.77 mg/dL (ref 0.40–1.20)
GFR: 84.33 mL/min (ref >60.00)
Glucose, Bld: 92 mg/dL (ref 70–99)
Potassium: 4 meq/L (ref 3.5–5.1)
Sodium: 137 meq/L (ref 135–145)
Total Bilirubin: 0.4 mg/dL (ref 0.2–1.2)
Total Protein: 7.7 g/dL (ref 6.0–8.3)

## 2023-03-27 LAB — T4, FREE: Free T4: 0.7 ng/dL (ref 0.60–1.60)

## 2023-03-27 LAB — TSH: TSH: 1.5 u[IU]/mL (ref 0.35–5.50)

## 2023-03-27 LAB — HEMOGLOBIN A1C: Hgb A1c MFr Bld: 6 % (ref 4.6–6.5)

## 2023-03-27 LAB — T3, FREE: T3, Free: 3.3 pg/mL (ref 2.3–4.2)

## 2023-03-27 LAB — VITAMIN D 25 HYDROXY (VIT D DEFICIENCY, FRACTURES): VITD: 35.54 ng/mL (ref 30.00–100.00)

## 2023-03-27 NOTE — Assessment & Plan Note (Addendum)
Last A1c- 6.1. Will check A1c today. She has not been watching her diet. Encouraged to work on [employer] and exercise.

## 2023-03-27 NOTE — Progress Notes (Signed)
Shelia Dicker, NP-C Phone: [phone]  [name] is a 57 y.o. female who presents today for follow up.   She has recently noticed increased hair loss. She is also experiencing hot flashes. She feels like she is going through menopause again. She has been having fluctuations in her weight. She denies any Hx of thyroid problems. Denies skin changes. She does have occasional palpitations which she is taking Lopressor to help with.   HYPERTENSION Disease Monitoring: Blood pressure range- Not checking Chest pain- No      Dyspnea- No Medications: Compliance- Losartan, Hydrochlorothiazide, Lopressor Lightheadedness- No   Edema- No  Lab Results  Component Value Date   NA 138 09/20/2022   K 3.7 09/20/2022   CO2 30 09/20/2022   GLUCOSE 113 (H) 09/20/2022   BUN 11 09/20/2022   CREATININE 0.79 09/20/2022   CALCIUM 9.9 09/20/2022   EGFR 80 01/09/2022   GFRNONAA >60 05/26/2018    PREDIABETES Disease Monitoring: Blood Sugar ranges- Not checking Polyuria/phagia/dipsia- No      Optho- Yes Medications: Compliance- diet controlled Hypoglycemic symptoms- No  Lab Results  Component Value Date   HGBA1C 6.1 09/20/2022     HYPERLIPIDEMIA Disease Monitoring: See symptoms for Hypertension Medications: Compliance- Pravastatin Right upper quadrant pain- No  Muscle aches- No  Lab Results  Component Value Date   CHOL 198 09/20/2022   HDL 53.00 09/20/2022   LDLCALC 118 (H) 09/20/2022   LDLDIRECT 106 (H) 01/09/2022   TRIG 136.0 09/20/2022   CHOLHDL 4 09/20/2022      Social History   Tobacco Use  Smoking Status Never  Smokeless Tobacco Never    Current Outpatient Medications on File Prior to Visit  Medication Sig Dispense Refill   diphenhydramine-acetaminophen (TYLENOL PM) 25-500 MG TABS tablet Take 2 tablets by mouth at bedtime as needed.     hydrochlorothiazide (MICROZIDE) 12.5 MG capsule TAKE 1 CAPSULE BY MOUTH EACH MORNING 90 capsule 3   losartan (COZAAR) 25 MG tablet  TAKE 1 TABLET BY MOUTH DAILY 90 tablet 3   metoprolol tartrate (LOPRESSOR) 25 MG tablet Take 1 tablet (25 mg total) by mouth 2 (two) times daily. 180 tablet 3   pravastatin (PRAVACHOL) 20 MG tablet Take 1 tablet (20 mg total) by mouth daily. After 6 pm 90 tablet 3   Vitamin D, Ergocalciferol, (DRISDOL) 1.25 MG (50000 UNIT) CAPS capsule Take 1 capsule (50,000 Units total) by mouth every 7 (seven) days. 13 capsule 1   No current facility-administered medications on file prior to visit.    ROS see history of present illness  Objective  Physical Exam Vitals:   03/27/23 1310  BP: 124/86  Pulse: 61  Temp: 98 F (36.7 C)  SpO2: 97%    BP Readings from Last 3 Encounters:  03/27/23 124/86  09/20/22 116/68  03/02/22 120/65   Wt Readings from Last 3 Encounters:  03/27/23 179 lb 12.8 oz (81.6 kg)  09/20/22 177 lb 12.8 oz (80.6 kg)  03/02/22 179 lb (81.2 kg)    Physical Exam Constitutional:      General: She is not in acute distress.    Appearance: Normal appearance.  HENT:     Head: Normocephalic.  Neck:     Thyroid: No thyromegaly or thyroid tenderness.  Cardiovascular:     Rate and Rhythm: Normal rate and regular rhythm.     Heart sounds: Normal heart sounds.  Pulmonary:     Effort: Pulmonary effort is normal.     Breath sounds: Normal breath  sounds.  Skin:    General: Skin is warm and dry.  Neurological:     General: No focal deficit present.     Mental Status: She is alert.  Psychiatric:        Mood and Affect: Mood normal.        Behavior: Behavior normal.    Assessment/Plan: Please see individual problem list.  Hair loss Assessment & Plan: Will check lab work as outlined. Further work up pending results. Will continue to monitor.   Orders: -     TSH -     T4, free -     T3, free  Primary hypertension Assessment & Plan: Chronic. Stable on Lopressor, Losartan and HCTZ. Continue.  Orders: -     Comprehensive metabolic panel  Pure  hypercholesterolemia Assessment & Plan: Chronic. Stable on Pravastatin daily. Continue. Encouraged healthy diet and exercise.    Prediabetes Assessment & Plan: Last A1c- 6.1. Will check A1c today. She has not been watching her diet. Encouraged to work on [employer] and exercise.   Orders: -     Hemoglobin A1c  Vitamin D deficiency Assessment & Plan: Completed 6 months of once weekly vit D 50,000 units. Will check vitamin D level today. Advised to start OTC daily vitamin D3 supplement. Will continue to monitor.   Orders: -     VITAMIN D 25 Hydroxy (Vit-D Deficiency, Fractures)  Need for influenza vaccination -     Flu vaccine trivalent PF, 6mos and older(Flulaval,Afluria,Fluarix,Fluzone)    Return in about 6 months (around 09/25/2023) for Follow up.   Shelia Dicker, NP-C [facility] - [employer]

## 2023-03-27 NOTE — Assessment & Plan Note (Signed)
Chronic. Stable on Lopressor, Losartan and HCTZ. Continue.

## 2023-03-27 NOTE — Assessment & Plan Note (Signed)
Will check lab work as outlined. Further work up pending results. Will continue to monitor.

## 2023-03-27 NOTE — Assessment & Plan Note (Signed)
Completed 6 months of once weekly vit D 50,000 units. Will check vitamin D level today. Advised to start OTC daily vitamin D3 supplement. Will continue to monitor.

## 2023-03-27 NOTE — Assessment & Plan Note (Signed)
Chronic. Stable on Pravastatin daily. Continue. Encouraged healthy diet and exercise.

## 2023-04-04 ENCOUNTER — Telehealth: Payer: Self-pay

## 2023-04-04 NOTE — Telephone Encounter (Signed)
LVM informing pt to CB in regards to lab results:     Hi Shelia Rush,   Your labs are stable. Your thyroid testing was all normal. Your vitamin D is within normal range now, I recommend starting on the daily over the counter supplement as we discussed today. Your A1c has slightly improved, I recommend continuing to work on healthy diet and exercise. Please let me know if you have any questions.   Thanks, Arville Lime, NP

## 2023-07-13 ENCOUNTER — Telehealth: Payer: Self-pay | Admitting: Nurse Practitioner

## 2023-07-13 NOTE — Telephone Encounter (Signed)
Copied from CRM [id number]. Topic: Clinical - Medication Question >> Jul 13, 2023 12:09 PM Samuel Jester B wrote: Reason for CRM: Pt stated that she would like to receive a call regarding the medication hydrochlorothiazide (MICROZIDE) 12.5 MG capsule due to her reading up on it and stated that the medication says it can cause kidney disease and said it runs in her family and would like to see if she can be prescribed something else.

## 2023-07-13 NOTE — Telephone Encounter (Signed)
Pt informed and verbalized understanding

## 2023-08-06 ENCOUNTER — Encounter: Payer: Self-pay | Admitting: Orthopaedic Surgery

## 2023-09-03 ENCOUNTER — Telehealth: Admitting: Nurse Practitioner

## 2023-09-03 ENCOUNTER — Ambulatory Visit: Payer: Self-pay | Admitting: Nurse Practitioner

## 2023-09-03 VITALS — Ht 65.0 in | Wt 179.8 lb

## 2023-09-03 DIAGNOSIS — J0140 Acute pansinusitis, unspecified: Secondary | ICD-10-CM

## 2023-09-03 MED ORDER — AMOXICILLIN-POT CLAVULANATE 875-125 MG PO TABS
1.0000 | ORAL_TABLET | Freq: Two times a day (BID) | ORAL | 0 refills | Status: DC
Start: 2023-09-03 — End: 2023-09-25

## 2023-09-03 NOTE — Progress Notes (Signed)
 MyChart Video Visit    Virtual Visit via Video Note   This visit type was conducted because this format is felt to be most appropriate for this patient at this time. Physical exam was limited by quality of the video and audio technology used for the visit. CMA was able to get the patient set up on a video visit.  Patient location: Home. Patient and provider in visit Provider location: Office  I discussed the limitations of evaluation and management by telemedicine and the availability of in person appointments. The patient expressed understanding and agreed to proceed.  Visit Date: 09/03/2023  Today's healthcare provider: Bethanie Dicker, NP     Subjective:    Patient ID: Shelia Rush, female    DOB: 03/19/1966, 58 y.o.   MRN: [id number]  No chief complaint on file.   HPI  Discussed the use of AI scribe software for clinical note transcription with the patient, who gave verbal consent to proceed.  History of Present Illness   The patient presents with symptoms suggestive of a sinus infection.   Symptoms began either Thursday or Friday, including a sensation of puffiness under her eyes and significant pressure across her forehead and above her eyebrows. The pressure is palpable and causes discomfort, although it is not visibly apparent.  No cough except when post-nasal drip occurs, and she can breathe fine despite the congestion. No chest involvement or shortness of breath. She experiences drainage running down her throat, causing slight irritation but not significant soreness. No fever, nausea, vomiting, diarrhea, or recent exposure to illnesses such as COVID-19 or flu. She has previously had COVID-19 and is familiar with its symptoms. She feels fatigued and worn out. No ear pain, but she notes a slight pressure in her ears, describing them as feeling 'kind of full'.  She has been using a warm mask on her forehead, which provides some relief. She has taken Sudafed, which  provided minimal relief, but she is cautious due to her blood pressure.      Past Medical History:  Diagnosis Date   Hyperlipidemia    Hypertension     Past Surgical History:  Procedure Laterality Date   CESAREAN SECTION  06/26/1996   COLONOSCOPY  2009   FRACTURE SURGERY  06/26/2002   left hip    Family History  Problem Relation Age of Onset   Heart failure Mother    Diabetes Mother    Hyperlipidemia Mother    Hypertension Mother    Mental illness Mother    Heart failure Father    Diabetes Father    Hyperlipidemia Father    Hypertension Father    Stroke Father    Colon cancer Neg Hx    Esophageal cancer Neg Hx    Stomach cancer Neg Hx     Social History   Socioeconomic History   Marital status: Married    Spouse name: Not on file   Number of children: 1   Years of education: Not on file   Highest education level: 12th grade  Occupational History   Not on file  Tobacco Use   Smoking status: Never   Smokeless tobacco: Never  Vaping Use   Vaping status: Never Used  Substance and Sexual Activity   Alcohol use: No   Drug use: Never   Sexual activity: Yes    Birth control/protection: Post-menopausal  Other Topics Concern   Not on file  Social History Narrative   Not on file   Social  Drivers of Corporate investment banker Strain: Low Risk  (09/03/2023)   Overall Financial Resource Strain (CARDIA)    Difficulty of Paying Living Expenses: Not hard at all  Food Insecurity: No Food Insecurity (09/03/2023)   Hunger Vital Sign    Worried About Running Out of Food in the Last Year: Never true    Ran Out of Food in the Last Year: Never true  Transportation Needs: No Transportation Needs (09/03/2023)   PRAPARE - Administrator, Civil Service (Medical): No    Lack of Transportation (Non-Medical): No  Physical Activity: Insufficiently Active (09/03/2023)   Exercise Vital Sign    Days of Exercise per Week: 1 day    Minutes of Exercise per Session: 10 min   Stress: No Stress Concern Present (09/03/2023)   [employer] of Occupational Health - Occupational Stress Questionnaire    Feeling of Stress : Only a little  Social Connections: Socially Integrated (09/03/2023)   Social Connection and Isolation Panel [NHANES]    Frequency of Communication with Friends and Family: More than three times a week    Frequency of Social Gatherings with Friends and Family: Twice a week    Attends Religious Services: More than 4 times per year    Active Member of [employer] or Organizations: Yes    Attends Engineer, structural: More than 4 times per year    Marital Status: Married  Catering manager Violence: Not on file    Outpatient Medications Prior to Visit  Medication Sig Dispense Refill   diphenhydramine-acetaminophen (TYLENOL PM) 25-500 MG TABS tablet Take 2 tablets by mouth at bedtime as needed.     hydrochlorothiazide (MICROZIDE) 12.5 MG capsule TAKE 1 CAPSULE BY MOUTH EACH MORNING 90 capsule 3   losartan (COZAAR) 25 MG tablet TAKE 1 TABLET BY MOUTH DAILY 90 tablet 3   metoprolol tartrate (LOPRESSOR) 25 MG tablet Take 1 tablet (25 mg total) by mouth 2 (two) times daily. 180 tablet 3   pravastatin (PRAVACHOL) 20 MG tablet Take 1 tablet (20 mg total) by mouth daily. After 6 pm 90 tablet 3   Vitamin D, Ergocalciferol, (DRISDOL) 1.25 MG (50000 UNIT) CAPS capsule Take 1 capsule (50,000 Units total) by mouth every 7 (seven) days. 13 capsule 1   No facility-administered medications prior to visit.    No Known Allergies  ROS See HPI    Objective:    Physical Exam  Ht 5\' 5"  (1.651 m)   Wt 179 lb 12.8 oz (81.6 kg)   LMP 11/30/2013   BMI 29.92 kg/m  Wt Readings from Last 3 Encounters:  09/03/23 179 lb 12.8 oz (81.6 kg)  03/27/23 179 lb 12.8 oz (81.6 kg)  09/20/22 177 lb 12.8 oz (80.6 kg)   GENERAL: alert, oriented, appears well and in no acute distress   HEENT: atraumatic, conjunttiva clear, no obvious abnormalities on inspection of  external nose and ears   NECK: normal movements of the head and neck   LUNGS: on inspection no signs of respiratory distress, breathing rate appears normal, no obvious gross SOB, gasping or wheezing   CV: no obvious cyanosis   MS: moves all visible extremities without noticeable abnormality   PSYCH/NEURO: pleasant and cooperative, no obvious depression or anxiety, speech and thought processing grossly intact    Assessment & Plan:   Problem List Items Addressed This Visit       Respiratory   Acute non-recurrent pansinusitis - Primary   Symptoms consistent with sinusitis. Prescribe  Augmentin 875 BID x 10 days and recommend nasal rinses with saline or nasal spray such as Flonase or azelastine. Discussed potential side effects. Recommend probiotic or yogurt to prevent diarrhea and encourage adequate fluid intake. Advise using warm compresses for relief and acetaminophen or ibuprofen for headaches. Return precautions given to patient.       Relevant Medications   amoxicillin-clavulanate (AUGMENTIN) 875-125 MG tablet    I am having Shelia Rush start on amoxicillin-clavulanate. I am also having her maintain her diphenhydramine-acetaminophen, metoprolol tartrate, pravastatin, losartan, hydrochlorothiazide, and Vitamin D (Ergocalciferol).  Meds ordered this encounter  Medications   amoxicillin-clavulanate (AUGMENTIN) 875-125 MG tablet    Sig: Take 1 tablet by mouth 2 (two) times daily.    Dispense:  20 tablet    Refill:  0    Supervising Provider:   Sherlene Shams [[id number]]   I discussed the assessment and treatment plan with the patient. The patient was provided an opportunity to ask questions and all were answered. The patient agreed with the plan and demonstrated an understanding of the instructions.   The patient was advised to call back or seek an in-person evaluation if the symptoms worsen or if the condition fails to improve as anticipated.   Bethanie Dicker, NP [facility] [phone] (phone) [phone] (fax)  [facility]

## 2023-09-03 NOTE — Telephone Encounter (Signed)
 Copied from CRM [id number]. Topic: Clinical - Red Word Triage >> Sep 03, 2023 11:24 AM Orinda Kenner C wrote: Red Word that prompted transfer to Nurse Triage: Patient [phone] thinks she has a sinus infection, dizziness, fatigue, right eye has drainage, sinus pressure and facial pain- behind the eyes, more painful on the right eye than the left eye. Patient has been using Sudafed and warm compressed. Patient denies a fever, nor shortness of breath. Patient wants a medication.   Chief Complaint: sinus pressure Symptoms: sinus pressure, headache, fatigue, nasal congestion, runny nose Frequency: continual Pertinent Negatives: Patient denies fever, cough, blocked nose, SOB Disposition: [] 911 / [] ED /[] Urgent Care (no appt availability in office) / [x] Appointment(In office/virtual)/ []  [facility]/ [] Home Care/ [] Refused Recommended Disposition /[] [facility]/ []  Follow-up with PCP Additional Notes: Pt reporting sinus pressure to forehead into her eyes and nose, nasal congestion and runny nose, that seems to be getting worse, as well as some ear pressure and sore throat. Pt reporting she "want to lay down all day [laughs]," pt making jokes and seems in good spirits. Pt confirms no severe pain, SOB, fever, or cough. Advised pt be examined in next couple days, pt reporting she cannot be seen in person, especially with immunocompromised spouse at home, was "hoping for" prescription. Nurse offered a virtual appt for symptoms, pt accepted, scheduled virtual appt with PCP today. Advised pt call back if any worsening of symptoms, pt verbalized understanding.  Reason for Disposition  Earache    Ear pressure yesterday not pain  Answer Assessment - Initial Assessment Questions 1. LOCATION: "Where does it hurt?"      Forehead above eyebrows a bit into eyes, sides of nose, pressure not pain-pain 2. ONSET: "When did the sinus pain start?"  (e.g., hours, days)      Since Saturday, getting  worse 3. SEVERITY: "How bad is the pain?"   (Scale 1-10; mild, moderate or severe)   - MILD (1-3): doesn't interfere with normal activities    - MODERATE (4-7): interferes with normal activities (e.g., work or school) or awakens from sleep   - SEVERE (8-10): excruciating pain and patient unable to do any normal activities        5/10, getting worse, yesterday 3 and today a 5, warm compress alleviates a little bit 4. RECURRENT SYMPTOM: "Have you ever had sinus problems before?" If Yes, ask: "When was the last time?" and "What happened that time?"      Yes years ago 5. NASAL CONGESTION: "Is the nose blocked?" If Yes, ask: "Can you open it or must you breathe through your mouth?"     Not completely blocked just feels stuffy 6. NASAL DISCHARGE: "Do you have discharge from your nose?" If so ask, "What color?"     Runny nose, unsure 7. FEVER: "Do you have a fever?" If Yes, ask: "What is it, how was it measured, and when did it start?"      no 8. OTHER SYMPTOMS: "Do you have any other symptoms?" (e.g., sore throat, cough, earache, difficulty breathing)     Runny nose, want to lay down all day, no cough, no ear pain, but pressure in ear yesterday, sore throat, need to drink more  Protocols used: Sinus Pain or Congestion-A-AH

## 2023-09-03 NOTE — Assessment & Plan Note (Signed)
 Symptoms consistent with sinusitis. Prescribe Augmentin 875 BID x 10 days and recommend nasal rinses with saline or nasal spray such as Flonase or azelastine. Discussed potential side effects. Recommend probiotic or yogurt to prevent diarrhea and encourage adequate fluid intake. Advise using warm compresses for relief and acetaminophen or ibuprofen for headaches. Return precautions given to patient.

## 2023-09-25 ENCOUNTER — Ambulatory Visit: Payer: 59 | Admitting: Nurse Practitioner

## 2023-09-25 VITALS — BP 120/76 | HR 71 | Temp 98.3°F | Ht 65.0 in | Wt 177.6 lb

## 2023-09-25 DIAGNOSIS — I10 Essential (primary) hypertension: Secondary | ICD-10-CM

## 2023-09-25 DIAGNOSIS — Z1231 Encounter for screening mammogram for malignant neoplasm of breast: Secondary | ICD-10-CM

## 2023-09-25 DIAGNOSIS — R42 Dizziness and giddiness: Secondary | ICD-10-CM

## 2023-09-25 DIAGNOSIS — R7303 Prediabetes: Secondary | ICD-10-CM

## 2023-09-25 DIAGNOSIS — E559 Vitamin D deficiency, unspecified: Secondary | ICD-10-CM | POA: Diagnosis not present

## 2023-09-25 DIAGNOSIS — Z0001 Encounter for general adult medical examination with abnormal findings: Secondary | ICD-10-CM

## 2023-09-25 DIAGNOSIS — E7800 Pure hypercholesterolemia, unspecified: Secondary | ICD-10-CM

## 2023-09-25 LAB — COMPREHENSIVE METABOLIC PANEL WITH GFR
ALT: 16 U/L (ref 0–35)
AST: 17 U/L (ref 0–37)
Albumin: 4.5 g/dL (ref 3.5–5.2)
Alkaline Phosphatase: 51 U/L (ref 39–117)
BUN: 13 mg/dL (ref 6–23)
CO2: 29 meq/L (ref 19–32)
Calcium: 9.6 mg/dL (ref 8.4–10.5)
Chloride: 100 meq/L (ref 96–112)
Creatinine, Ser: 0.74 mg/dL (ref 0.40–1.20)
GFR: 88.14 mL/min (ref >60.00)
Glucose, Bld: 118 mg/dL — ABNORMAL HIGH (ref 70–99)
Potassium: 4.4 meq/L (ref 3.5–5.1)
Sodium: 139 meq/L (ref 135–145)
Total Bilirubin: 0.5 mg/dL (ref 0.2–1.2)
Total Protein: 7.3 g/dL (ref 6.0–8.3)

## 2023-09-25 LAB — CBC WITH DIFFERENTIAL/PLATELET
Basophils Absolute: 0.1 10*3/uL (ref 0.0–0.1)
Basophils Relative: 0.7 % (ref 0.0–3.0)
Eosinophils Absolute: 0.1 10*3/uL (ref 0.0–0.7)
Eosinophils Relative: 0.8 % (ref 0.0–5.0)
HCT: 38.8 % (ref 36.0–46.0)
Hemoglobin: 13.1 g/dL (ref 12.0–15.0)
Lymphocytes Relative: 37.8 % (ref 12.0–46.0)
Lymphs Abs: 2.9 10*3/uL (ref 0.7–4.0)
MCHC: 33.6 g/dL (ref 30.0–36.0)
MCV: 94.8 fl (ref 78.0–100.0)
Monocytes Absolute: 0.6 10*3/uL (ref 0.1–1.0)
Monocytes Relative: 7.7 % (ref 3.0–12.0)
Neutro Abs: 4.1 10*3/uL (ref 1.4–7.7)
Neutrophils Relative %: 53 % (ref 43.0–77.0)
Platelets: 316 10*3/uL (ref 150.0–400.0)
RBC: 4.1 Mil/uL (ref 3.87–5.11)
RDW: 13.5 % (ref 11.5–15.5)
WBC: 7.7 10*3/uL (ref 4.0–10.5)

## 2023-09-25 LAB — LIPID PANEL
Cholesterol: 200 mg/dL (ref 0–200)
HDL: 50.9 mg/dL (ref >39.00)
LDL Cholesterol: 127 mg/dL — ABNORMAL HIGH (ref 0–99)
NonHDL: 148.89
Total CHOL/HDL Ratio: 4
Triglycerides: 111 mg/dL (ref 0.0–149.0)
VLDL: 22.2 mg/dL (ref 0.0–40.0)

## 2023-09-25 LAB — TSH: TSH: 1.84 u[IU]/mL (ref 0.35–5.50)

## 2023-09-25 LAB — HEMOGLOBIN A1C: Hgb A1c MFr Bld: 6 % (ref 4.6–6.5)

## 2023-09-25 LAB — VITAMIN B12: Vitamin B-12: 177 pg/mL — ABNORMAL LOW (ref 211–911)

## 2023-09-25 LAB — VITAMIN D 25 HYDROXY (VIT D DEFICIENCY, FRACTURES): VITD: 21.25 ng/mL — ABNORMAL LOW (ref 30.00–100.00)

## 2023-09-25 MED ORDER — HYDROCHLOROTHIAZIDE 12.5 MG PO CAPS
ORAL_CAPSULE | ORAL | 3 refills | Status: AC
Start: 2023-09-25 — End: ?

## 2023-09-25 MED ORDER — LOSARTAN POTASSIUM 25 MG PO TABS: ORAL_TABLET | ORAL | 3 refills | Status: AC

## 2023-09-25 MED ORDER — METOPROLOL TARTRATE 25 MG PO TABS: 25.0000 mg | ORAL_TABLET | Freq: Two times a day (BID) | ORAL | 3 refills | Status: AC

## 2023-09-25 MED ORDER — PRAVASTATIN SODIUM 20 MG PO TABS: 20.0000 mg | ORAL_TABLET | Freq: Every day | ORAL | 3 refills | Status: AC

## 2023-09-25 NOTE — Progress Notes (Unsigned)
 Shelia Dicker, NP-C Phone: [phone]  Shelia Rush is a 58 y.o. female who presents today for annual exam.   Discussed the use of AI scribe software for clinical note transcription with the patient, who gave verbal consent to proceed.  History of Present Illness   Shelia Rush is a 58 year old female with hypertension and prediabetes who presents for an annual physical exam.  She experiences dizziness, which is not severe but noticeable, primarily occurring when she gets up from bed. This has led to incidents of running into walls or tripping over her dog. The dizziness is described as 'little spots and dizziness' and is not constant, sometimes occurring with position changes and sometimes spontaneously. She monitors her blood pressure at home, which is usually slightly elevated, but today it was 120/76 mmHg. She is on losartan, Lopressor, and hydrochlorothiazide for blood pressure management.  She has a history of prediabetes, currently managed with diet control. She acknowledges stress eating due to her husband's health issues, leading to weight gain. Her family history includes diabetes in both parents, and she had gestational diabetes during pregnancy, requiring insulin.  She experiences clear nasal discharge and tension headaches, suggesting possible allergies, although she does not take any allergy medication. Recently, she had a severe sinus headache that was treated with antibiotics, which helped.  Her social history reveals she does not smoke, drink alcohol, or use drugs. She is active but does not engage in regular exercise. She has a supportive relationship with her husband, who has had a kidney transplant. No chest pain, shortness of breath, abdominal pain, trouble swallowing, swelling in legs, skin changes, rashes, mood problems, anxiety, or depression. She reports occasional constipation and diarrhea, tension headaches, and heart palpitations. She sleeps well. She had a pap  smear and colonoscopy in 2023.  She is due for a mammogram in May.      Social History   Tobacco Use  Smoking Status Never  Smokeless Tobacco Never    Current Outpatient Medications on File Prior to Visit  Medication Sig Dispense Refill   diphenhydramine-acetaminophen (TYLENOL PM) 25-500 MG TABS tablet Take 2 tablets by mouth at bedtime as needed.     No current facility-administered medications on file prior to visit.     ROS see history of present illness  Objective  Physical Exam Vitals:   09/25/23 0838  BP: 120/76  Pulse: 71  Temp: 98.3 F (36.8 C)  SpO2: 95%    BP Readings from Last 3 Encounters:  09/25/23 120/76  03/27/23 124/86  09/20/22 116/68   Wt Readings from Last 3 Encounters:  09/25/23 177 lb 9.6 oz (80.6 kg)  09/03/23 179 lb 12.8 oz (81.6 kg)  03/27/23 179 lb 12.8 oz (81.6 kg)    Physical Exam Constitutional:      General: She is not in acute distress.    Appearance: Normal appearance.  HENT:     Head: Normocephalic.     Right Ear: Tympanic membrane normal.     Left Ear: Tympanic membrane normal.     Nose: Nose normal.     Mouth/Throat:     Mouth: Mucous membranes are moist.     Pharynx: Oropharynx is clear.  Eyes:     Conjunctiva/sclera: Conjunctivae normal.     Pupils: Pupils are equal, round, and reactive to light.  Neck:     Thyroid: No thyromegaly.  Cardiovascular:     Rate and Rhythm: Normal rate and regular rhythm.     Heart  sounds: Normal heart sounds.  Pulmonary:     Effort: Pulmonary effort is normal.     Breath sounds: Normal breath sounds.  Abdominal:     General: Abdomen is flat. Bowel sounds are normal.     Palpations: Abdomen is soft. There is no mass.     Tenderness: There is no abdominal tenderness.  Musculoskeletal:        General: Normal range of motion.  Lymphadenopathy:     Cervical: No cervical adenopathy.  Skin:    General: Skin is warm and dry.     Findings: No rash.  Neurological:     General: No  focal deficit present.     Mental Status: She is alert.  Psychiatric:        Mood and Affect: Mood normal.        Behavior: Behavior normal.     Assessment/Plan: Please see individual problem list.  Encounter for routine adult health examination with abnormal findings Assessment & Plan: Physical exam complete. We will order lab work as outlined and contact patient with results. Pap smear and colonoscopy are up to date. Flu, tetanus and COVID vaccines are up to date. She has completed the shingles vaccine series. Advised to maintain dental care and increase physical activity. Order mammogram for May 2025. Advise finding a dentist and encourage increased physical activity. Return to care in 6 months, sooner if needed.    Dizziness Assessment & Plan: She experiences intermittent dizziness with position changes, possibly due to blood pressure fluctuations or allergies affecting ear balance. Cardiac causes are also considered. Advise slow position changes and ensure adequate hydration. Check labs to rule out underlying issues. Consider antihistamine for allergy symptoms and cardiac monitoring if symptoms persist. She will contact if worsening or becoming more persistent. Will continue to monitor.   Orders: -     TSH -     Vitamin B12  Primary hypertension Assessment & Plan: Her hypertension is well-controlled with current medications. Continue losartan, Lopressor, and hydrochlorothiazide. We will check lab work as outlined. Concern that orthostasis is contributing to dizziness. She will continue to monitor blood pressure at home.  Orders: -     CBC with Differential/Platelet -     Comprehensive metabolic panel with GFR -     Metoprolol Tartrate; Take 1 tablet (25 mg total) by mouth 2 (two) times daily.  Dispense: 180 tablet; Refill: 3 -     Losartan Potassium; TAKE 1 TABLET BY MOUTH DAILY  Dispense: 90 tablet; Refill: 3 -     hydroCHLOROthiazide; TAKE 1 CAPSULE BY MOUTH EACH MORNING   Dispense: 90 capsule; Refill: 3  Pure hypercholesterolemia Assessment & Plan: Managed with Pravastatin 20 mg daily. Continue. We will check lipid panel today. Encourage healthy diet and regular exercise.   Orders: -     Lipid panel -     Pravastatin Sodium; Take 1 tablet (20 mg total) by mouth daily. After 6 pm  Dispense: 90 tablet; Refill: 3  Prediabetes Assessment & Plan: Prediabetes is managed with diet control. Risk factors include family history and past gestational diabetes. Advise dietary modifications and encourage reduction of snack foods and stress eating. Check A1c today.   Orders: -     Hemoglobin A1c  Vitamin D deficiency -     VITAMIN D 25 Hydroxy (Vit-D Deficiency, Fractures)  Screening mammogram for breast cancer -     3D Screening Mammogram, Left and Right; Future    Return in about 6 months (around 03/26/2024)  for Follow up.   Shelia Dicker, NP-C [facility]

## 2023-09-26 ENCOUNTER — Encounter: Payer: Self-pay | Admitting: Nurse Practitioner

## 2023-09-26 NOTE — Assessment & Plan Note (Signed)
 Her hypertension is well-controlled with current medications. Continue losartan, Lopressor, and hydrochlorothiazide. We will check lab work as outlined. Concern that orthostasis is contributing to dizziness. She will continue to monitor blood pressure at home.

## 2023-09-26 NOTE — Assessment & Plan Note (Signed)
 Prediabetes is managed with diet control. Risk factors include family history and past gestational diabetes. Advise dietary modifications and encourage reduction of snack foods and stress eating. Check A1c today.

## 2023-09-26 NOTE — Assessment & Plan Note (Signed)
 Physical exam complete. We will order lab work as outlined and contact patient with results. Pap smear and colonoscopy are up to date. Flu, tetanus and COVID vaccines are up to date. She has completed the shingles vaccine series. Advised to maintain dental care and increase physical activity. Order mammogram for May 2025. Advise finding a dentist and encourage increased physical activity. Return to care in 6 months, sooner if needed.

## 2023-09-26 NOTE — Assessment & Plan Note (Signed)
 Managed with Pravastatin 20 mg daily. Continue. We will check lipid panel today. Encourage healthy diet and regular exercise.

## 2023-09-26 NOTE — Assessment & Plan Note (Signed)
 She experiences intermittent dizziness with position changes, possibly due to blood pressure fluctuations or allergies affecting ear balance. Cardiac causes are also considered. Advise slow position changes and ensure adequate hydration. Check labs to rule out underlying issues. Consider antihistamine for allergy symptoms and cardiac monitoring if symptoms persist. She will contact if worsening or becoming more persistent. Will continue to monitor.

## 2023-10-01 ENCOUNTER — Ambulatory Visit

## 2023-10-01 NOTE — Progress Notes (Deleted)
 Pt presented for their vitamin B12 injection. Pt was identified through two identifiers. Pt tolerated shot well in their left or right deltoid.

## 2023-10-08 ENCOUNTER — Ambulatory Visit (INDEPENDENT_AMBULATORY_CARE_PROVIDER_SITE_OTHER)

## 2023-10-08 DIAGNOSIS — E538 Deficiency of other specified B group vitamins: Secondary | ICD-10-CM | POA: Diagnosis not present

## 2023-10-08 MED ORDER — CYANOCOBALAMIN 1000 MCG/ML IJ SOLN
1000.0000 ug | Freq: Once | INTRAMUSCULAR | Status: AC
  Administered 2023-10-08: 1000 ug via INTRAMUSCULAR

## 2023-10-08 NOTE — Progress Notes (Signed)
 Patient presented for 1st  B 12 injection to left deltoid, patient voiced no concerns nor showed any signs of distress during injection

## 2023-10-15 ENCOUNTER — Ambulatory Visit (INDEPENDENT_AMBULATORY_CARE_PROVIDER_SITE_OTHER)

## 2023-10-15 DIAGNOSIS — E538 Deficiency of other specified B group vitamins: Secondary | ICD-10-CM | POA: Diagnosis not present

## 2023-10-15 MED ORDER — CYANOCOBALAMIN 1000 MCG/ML IJ SOLN
1000.0000 ug | Freq: Once | INTRAMUSCULAR | Status: AC
  Administered 2023-10-15: 1000 ug via INTRAMUSCULAR

## 2023-10-15 NOTE — Progress Notes (Signed)
 Pt presented for their vitamin B12 injection. Pt was identified through two identifiers. Pt tolerated shot well in their right deltoid.

## 2023-10-23 ENCOUNTER — Ambulatory Visit (INDEPENDENT_AMBULATORY_CARE_PROVIDER_SITE_OTHER)

## 2023-10-23 DIAGNOSIS — E538 Deficiency of other specified B group vitamins: Secondary | ICD-10-CM | POA: Diagnosis not present

## 2023-10-23 MED ORDER — CYANOCOBALAMIN 1000 MCG/ML IJ SOLN
1000.0000 ug | Freq: Once | INTRAMUSCULAR | Status: AC
  Administered 2023-10-23: 1000 ug via INTRAMUSCULAR

## 2023-10-23 NOTE — Progress Notes (Signed)
 Patient presented for B 12 injection to left deltoid, patient voiced no concerns nor showed any signs of distress during injection.

## 2023-10-29 ENCOUNTER — Ambulatory Visit (INDEPENDENT_AMBULATORY_CARE_PROVIDER_SITE_OTHER)

## 2023-10-29 DIAGNOSIS — E538 Deficiency of other specified B group vitamins: Secondary | ICD-10-CM | POA: Diagnosis not present

## 2023-10-29 MED ORDER — CYANOCOBALAMIN 1000 MCG/ML IJ SOLN
1000.0000 ug | Freq: Once | INTRAMUSCULAR | Status: AC
  Administered 2023-10-29: 1000 ug via INTRAMUSCULAR

## 2023-10-29 NOTE — Progress Notes (Signed)
 Pt presented for their vitamin B12 injection. Pt was identified through two identifiers. Pt tolerated shot well in their right deltoid.

## 2023-11-02 ENCOUNTER — Telehealth: Payer: Self-pay

## 2023-11-02 ENCOUNTER — Ambulatory Visit: Payer: Self-pay

## 2023-11-02 ENCOUNTER — Ambulatory Visit: Admitting: Nurse Practitioner

## 2023-11-02 ENCOUNTER — Encounter: Payer: Self-pay | Admitting: Nurse Practitioner

## 2023-11-02 ENCOUNTER — Ambulatory Visit
Admission: RE | Admit: 2023-11-02 | Discharge: 2023-11-02 | Disposition: A | Discharge status: Home / Self Care | Source: Ambulatory Visit | Attending: Nurse Practitioner | Admitting: Nurse Practitioner

## 2023-11-02 VITALS — BP 122/80 | HR 78 | Temp 98.3°F | Ht 65.0 in | Wt 178.4 lb

## 2023-11-02 DIAGNOSIS — R5383 Other fatigue: Secondary | ICD-10-CM | POA: Diagnosis not present

## 2023-11-02 DIAGNOSIS — M7989 Other specified soft tissue disorders: Secondary | ICD-10-CM | POA: Diagnosis present

## 2023-11-02 LAB — POCT URINALYSIS DIPSTICK
Bilirubin, UA: NEGATIVE
Blood, UA: NEGATIVE
Glucose, UA: NEGATIVE
Ketones, UA: NEGATIVE
Nitrite, UA: NEGATIVE
Protein, UA: NEGATIVE
Spec Grav, UA: 1.02 (ref 1.010–1.025)
Urobilinogen, UA: 1 U/dL
pH, UA: 7.5 (ref 5.0–8.0)

## 2023-11-02 NOTE — Telephone Encounter (Signed)
 Patient states at check-out that she believes she is supposed to schedule a follow-up appointment, but she is not sure how far out.  Reynolds Cea, CMA, was not available at the time of the call.  I let patient know that I will send a message to Tona Francis, NP, to find out how to schedule her follow-up.

## 2023-11-02 NOTE — Telephone Encounter (Signed)
 Copied from CRM [id number]. Topic: Clinical - Red Word Triage >> Nov 02, 2023 12:59 PM Clyde Darling P wrote: Red Word that prompted transfer to Nurse Triage: right leg swelling  Chief Complaint: Leg swelling Symptoms: Moderate swelling Frequency: A month, present now Pertinent Negatives: Patient denies fever Disposition: [] ED /[] Urgent Care (no appt availability in office) / [x] Appointment(In office/virtual)/ []  [facility]/ [] Home Care/ [] Refused Recommended Disposition /[] [facility]/ []  Follow-up with PCP Additional Notes: Patient called in to report swelling of right leg. Patient stated swelling extends from below the knee to the foot. Patient denied redness, pain, fever and difficulty breathing. Patient stated she can still walk normally. Advised patient to be seen within 24 hours, per protocol. No availability with PCP. Scheduled with alternate provider in office today. Provided care advice and instructed patient to call back if symptoms worsen. Patient complied.   Reason for Disposition  [1] MODERATE leg swelling (e.g., swelling extends up to knees) AND [2] new-onset or worsening  Answer Assessment - Initial Assessment Questions 1. ONSET: "When did the swelling start?" (e.g., minutes, hours, days)     A month 2. LOCATION: "What part of the leg is swollen?"  "Are both legs swollen or just one leg?"     Right leg 3. SEVERITY: "How bad is the swelling?" (e.g., localized; mild, moderate, severe)   - Localized: Small area of swelling localized to one leg.   - MILD pedal edema: Swelling limited to foot and ankle, pitting edema < 1/4 inch (6 mm) deep, rest and elevation eliminate most or all swelling.   - MODERATE edema: Swelling of lower leg to knee, pitting edema > 1/4 inch (6 mm) deep, rest and elevation only partially reduce swelling.   - SEVERE edema: Swelling extends above knee, facial or hand swelling present.      Moderate- from knee down 4. REDNESS: "Does the  swelling look red or infected?"     Denies 5. PAIN: "Is the swelling painful to touch?" If Yes, ask: "How painful is it?"   (Scale 1-10; mild, moderate or severe)     Denies, "just normal aches and pain" 6. FEVER: "Do you have a fever?" If Yes, ask: "What is it, how was it measured, and when did it start?"      Denies 7. CAUSE: "What do you think is causing the leg swelling?"     Denies 8. MEDICAL HISTORY: "Do you have a history of blood clots (e.g., DVT), cancer, heart failure, kidney disease, or liver failure?"     Denies 9. RECURRENT SYMPTOM: "Have you had leg swelling before?" If Yes, ask: "When was the last time?" "What happened that time?"     Denies 10. OTHER SYMPTOMS: "Do you have any other symptoms?" (e.g., chest pain, difficulty breathing)     "I don't feel swelling", "some times at night I have a pain in the right side of my chest that comes and goes away quickly", denies difficulty breathing  Protocols used: Leg Swelling and Edema-A-AH

## 2023-11-02 NOTE — Progress Notes (Signed)
 Established Patient Office Visit  Subjective:  Patient ID: Shelia Rush, female    DOB: [birth year]  Age: 58 y.o. MRN: [id number]  CC:  Chief Complaint  Patient presents with   Acute Visit    Right leg swelling Feels tight  Tired takes long afternoon naps x 1 month    HPI  Shelia Rush is a 58 year old female who presents with intermittent right leg swelling. Shelia Rush is accompanied by her husband.  Intermittent swelling of the right leg has been present for about a month, occurring approximately three times a week. The swelling is more noticeable with sock imprints and is accompanied by a feeling of heaviness, affecting her gait. Standing for long periods aggravates the swelling, which worsens as the day progresses. There is no associated pain, knee pain, changes in sensation, or redness.  Shelia Rush experiences increased fatigue, requiring heavier naps in the afternoon. Shelia Rush feels bloated and full despite stable weight. There is increased thirst and frequent urination, with less urine volume than expected. Her recent A1c was 6.0. Shelia Rush has a history of gestational diabetes.   HPI   Past Medical History:  Diagnosis Date   Hyperlipidemia    Hypertension     Past Surgical History:  Procedure Laterality Date   CESAREAN SECTION  06/26/1996   COLONOSCOPY  2009   FRACTURE SURGERY  06/26/2002   left hip    Family History  Problem Relation Age of Onset   Heart failure Mother    Diabetes Mother    Hyperlipidemia Mother    Hypertension Mother    Mental illness Mother    Heart failure Father    Diabetes Father    Hyperlipidemia Father    Hypertension Father    Stroke Father    Colon cancer Neg Hx    Esophageal cancer Neg Hx    Stomach cancer Neg Hx     Social History   Socioeconomic History   Marital status: Married    Spouse name: Not on file   Number of children: 1   Years of education: Not on file   Highest education level: 12th grade  Occupational History   Not  on file  Tobacco Use   Smoking status: Never   Smokeless tobacco: Never  Vaping Use   Vaping status: Never Used  Substance and Sexual Activity   Alcohol use: No   Drug use: Never   Sexual activity: Yes    Birth control/protection: Post-menopausal  Other Topics Concern   Not on file  Social History Narrative   Not on file   Social Drivers of Health   Financial Resource Strain: Low Risk  (09/03/2023)   Overall Financial Resource Strain (CARDIA)    Difficulty of Paying Living Expenses: Not hard at all  Food Insecurity: No Food Insecurity (09/03/2023)   Hunger Vital Sign    Worried About Running Out of Food in the Last Year: Never true    Ran Out of Food in the Last Year: Never true  Transportation Needs: No Transportation Needs (09/03/2023)   PRAPARE - Administrator, Civil Service (Medical): No    Lack of Transportation (Non-Medical): No  Physical Activity: Insufficiently Active (09/03/2023)   Exercise Vital Sign    Days of Exercise per Week: 1 day    Minutes of Exercise per Session: 10 min  Stress: No Stress Concern Present (09/03/2023)   [employer] of Occupational Health - Occupational Stress Questionnaire    Feeling of Stress :  Only a little  Social Connections: Socially Integrated (09/03/2023)   Social Connection and Isolation Panel [NHANES]    Frequency of Communication with Friends and Family: More than three times a week    Frequency of Social Gatherings with Friends and Family: Twice a week    Attends Religious Services: More than 4 times per year    Active Member of [employer] or Organizations: Yes    Attends Engineer, structural: More than 4 times per year    Marital Status: Married  Catering manager Violence: Not on file     Outpatient Medications Prior to Visit  Medication Sig Dispense Refill   diphenhydramine -acetaminophen  (TYLENOL  PM) 25-500 MG TABS tablet Take 2 tablets by mouth at bedtime as needed.     hydrochlorothiazide  (MICROZIDE )  12.5 MG capsule TAKE 1 CAPSULE BY MOUTH EACH MORNING 90 capsule 3   losartan  (COZAAR ) 25 MG tablet TAKE 1 TABLET BY MOUTH DAILY 90 tablet 3   metoprolol  tartrate (LOPRESSOR ) 25 MG tablet Take 1 tablet (25 mg total) by mouth 2 (two) times daily. 180 tablet 3   pravastatin  (PRAVACHOL ) 20 MG tablet Take 1 tablet (20 mg total) by mouth daily. After 6 pm 90 tablet 3   No facility-administered medications prior to visit.    No Known Allergies  ROS Review of Systems Negative unless indicated in HPI.    Objective:     Physical Exam Constitutional:      Appearance: Normal appearance.  Cardiovascular:     Rate and Rhythm: Normal rate and regular rhythm.     Pulses: Normal pulses.     Heart sounds: Normal heart sounds.  Pulmonary:     Effort: Pulmonary effort is normal.     Breath sounds: Normal breath sounds. No wheezing.  Musculoskeletal:     Right lower leg: Tenderness present. No swelling.     Left lower leg: No swelling.  Neurological:     Mental Status: Shelia Rush is alert.     BP 122/80   Pulse 78   Temp 98.3 F (36.8 C)   Ht 5\' 5"  (1.651 m)   Wt 178 lb 6.4 oz (80.9 kg)   LMP 11/30/2013   SpO2 97%   BMI 29.69 kg/m  Wt Readings from Last 3 Encounters:  11/02/23 178 lb 6.4 oz (80.9 kg)  09/25/23 177 lb 9.6 oz (80.6 kg)  09/03/23 179 lb 12.8 oz (81.6 kg)     Health Maintenance  Topic Date Due   HIV Screening  Never done   COVID-19 Vaccine (3 - 2024-25 season) 11/18/2023 (Originally 02/25/2023)   INFLUENZA VACCINE  01/25/2024   MAMMOGRAM  11/12/2024   Colonoscopy  03/02/2025   Cervical Cancer Screening (HPV/Pap Cotest)  09/10/2026   DTaP/Tdap/Td (2 - Td or Tdap) 09/10/2031   Hepatitis C Screening  Completed   Zoster Vaccines- Shingrix  Completed   HPV VACCINES  Aged Out   Meningococcal B Vaccine  Aged Out    There are no preventive care reminders to display for this patient.  Lab Results  Component Value Date   TSH 1.84 09/25/2023   Lab Results  Component  Value Date   WBC 7.7 09/25/2023   HGB 13.1 09/25/2023   HCT 38.8 09/25/2023   MCV 94.8 09/25/2023   PLT 316.0 09/25/2023   Lab Results  Component Value Date   NA 139 09/25/2023   K 4.4 09/25/2023   CO2 29 09/25/2023   GLUCOSE 118 (H) 09/25/2023   BUN 13 09/25/2023  CREATININE 0.74 09/25/2023   BILITOT 0.5 09/25/2023   ALKPHOS 51 09/25/2023   AST 17 09/25/2023   ALT 16 09/25/2023   PROT 7.3 09/25/2023   ALBUMIN 4.5 09/25/2023   CALCIUM 9.6 09/25/2023   ANIONGAP 8 05/26/2018   EGFR 80 01/09/2022   GFR 88.14 09/25/2023   Lab Results  Component Value Date   CHOL 200 09/25/2023   Lab Results  Component Value Date   HDL 50.90 09/25/2023   Lab Results  Component Value Date   LDLCALC 127 (H) 09/25/2023   Lab Results  Component Value Date   TRIG 111.0 09/25/2023   Lab Results  Component Value Date   CHOLHDL 4 09/25/2023   Lab Results  Component Value Date   HGBA1C 6.0 09/25/2023      Assessment & Plan:  Right leg swelling Assessment & Plan: Intermittent right leg swelling, aggravated by standing. Leg feels heavy and altered sensation compared to left leg.  -Will perform US  to rule DVT.  -Will check labs as outlined. - Recommend compression stockings and leg elevation.    Orders: -     US  Venous Img Lower Unilateral Right (DVT); Future -     Comprehensive metabolic panel with GFR  Other fatigue Assessment & Plan: Increased fatigue, appetite changes. - Will check labs as outlined.    Orders: -     CBC -     Comprehensive metabolic panel with GFR -     TSH -     POCT urinalysis dipstick -     Microalbumin / creatinine urine ratio    Follow-up: No follow-ups on file.   Barri Neidlinger, NP

## 2023-11-02 NOTE — Assessment & Plan Note (Addendum)
 Intermittent right leg swelling, aggravated by standing. No swelling noticed today. Patient reports leg feels heavy and altered sensation compared to left leg.  -Will perform US  to rule DVT.  -Will check labs as outlined. - Recommend compression stockings and leg elevation.

## 2023-11-02 NOTE — Assessment & Plan Note (Signed)
 Increased fatigue, appetite changes. - Will check labs as outlined.

## 2023-11-03 LAB — COMPREHENSIVE METABOLIC PANEL WITH GFR
ALT: 21 IU/L (ref 0–32)
AST: 24 IU/L (ref 0–40)
Albumin: 4.6 g/dL (ref 3.8–4.9)
Alkaline Phosphatase: 60 IU/L (ref 44–121)
BUN/Creatinine Ratio: 14 (ref 12–28)
BUN: 16 mg/dL (ref 8–27)
Bilirubin Total: 0.4 mg/dL (ref 0.0–1.2)
CO2: 26 mmol/L (ref 20–29)
Calcium: 10 mg/dL (ref 8.7–10.3)
Chloride: 100 mmol/L (ref 96–106)
Creatinine, Ser: 1.11 mg/dL — ABNORMAL HIGH (ref 0.57–1.00)
Globulin, Total: 2.9 g/dL (ref 1.5–4.5)
Glucose: 88 mg/dL (ref 70–99)
Potassium: 4.4 mmol/L (ref 3.5–5.2)
Sodium: 143 mmol/L (ref 134–144)
Total Protein: 7.5 g/dL (ref 6.0–8.5)
eGFR: 57 mL/min/{1.73_m2} — ABNORMAL LOW (ref >59)

## 2023-11-03 LAB — CBC
Hematocrit: 39.1 % (ref 34.0–46.6)
Hemoglobin: 13.1 g/dL (ref 11.1–15.9)
MCH: 31.9 pg (ref 26.6–33.0)
MCHC: 33.5 g/dL (ref 31.5–35.7)
MCV: 95 fL (ref 79–97)
Platelets: 328 10*3/uL (ref 150–450)
RBC: 4.11 x10E6/uL (ref 3.77–5.28)
RDW: 13 % (ref 11.7–15.4)
WBC: 11.3 10*3/uL — ABNORMAL HIGH (ref 3.4–10.8)

## 2023-11-03 LAB — TSH: TSH: 1.26 u[IU]/mL (ref 0.450–4.500)

## 2023-11-04 ENCOUNTER — Other Ambulatory Visit: Payer: Self-pay | Admitting: Nurse Practitioner

## 2023-11-04 ENCOUNTER — Encounter: Payer: Self-pay | Admitting: Nurse Practitioner

## 2023-11-04 DIAGNOSIS — N289 Disorder of kidney and ureter, unspecified: Secondary | ICD-10-CM

## 2023-11-04 DIAGNOSIS — R7989 Other specified abnormal findings of blood chemistry: Secondary | ICD-10-CM

## 2023-11-04 LAB — MICROALBUMIN / CREATININE URINE RATIO
Creatinine, Urine: 187.9 mg/dL
Microalb/Creat Ratio: 5 mg/g{creat} (ref 0–29)
Microalbumin, Urine: 8.6 ug/mL

## 2023-11-04 NOTE — Telephone Encounter (Signed)
 Please schedule lab appointment in a week followed by office visit.

## 2023-11-05 NOTE — Telephone Encounter (Signed)
 Patient is scheduled for lab on 11/13/23 at 10:30 and office visit on 11/15/23 at 8:20.

## 2023-11-07 NOTE — Telephone Encounter (Signed)
 Noted.

## 2023-11-13 ENCOUNTER — Other Ambulatory Visit (INDEPENDENT_AMBULATORY_CARE_PROVIDER_SITE_OTHER)

## 2023-11-13 DIAGNOSIS — N289 Disorder of kidney and ureter, unspecified: Secondary | ICD-10-CM | POA: Diagnosis not present

## 2023-11-13 DIAGNOSIS — R7989 Other specified abnormal findings of blood chemistry: Secondary | ICD-10-CM | POA: Diagnosis not present

## 2023-11-13 LAB — CBC WITH DIFFERENTIAL/PLATELET
Basophils Absolute: 0 10*3/uL (ref 0.0–0.1)
Basophils Relative: 0.5 % (ref 0.0–3.0)
Eosinophils Absolute: 0.1 10*3/uL (ref 0.0–0.7)
Eosinophils Relative: 1.1 % (ref 0.0–5.0)
HCT: 39 % (ref 36.0–46.0)
Hemoglobin: 13.3 g/dL (ref 12.0–15.0)
Lymphocytes Relative: 37.1 % (ref 12.0–46.0)
Lymphs Abs: 3 10*3/uL (ref 0.7–4.0)
MCHC: 34.1 g/dL (ref 30.0–36.0)
MCV: 93.1 fl (ref 78.0–100.0)
Monocytes Absolute: 0.5 10*3/uL (ref 0.1–1.0)
Monocytes Relative: 6.4 % (ref 3.0–12.0)
Neutro Abs: 4.5 10*3/uL (ref 1.4–7.7)
Neutrophils Relative %: 54.9 % (ref 43.0–77.0)
Platelets: 329 10*3/uL (ref 150.0–400.0)
RBC: 4.19 Mil/uL (ref 3.87–5.11)
RDW: 12.9 % (ref 11.5–15.5)
WBC: 8.1 10*3/uL (ref 4.0–10.5)

## 2023-11-13 LAB — BASIC METABOLIC PANEL WITH GFR
BUN: 17 mg/dL (ref 6–23)
CO2: 27 meq/L (ref 19–32)
Calcium: 9.7 mg/dL (ref 8.4–10.5)
Chloride: 98 meq/L (ref 96–112)
Creatinine, Ser: 0.73 mg/dL (ref 0.40–1.20)
GFR: 89.51 mL/min (ref >60.00)
Glucose, Bld: 112 mg/dL — ABNORMAL HIGH (ref 70–99)
Potassium: 3.6 meq/L (ref 3.5–5.1)
Sodium: 136 meq/L (ref 135–145)

## 2023-11-15 ENCOUNTER — Ambulatory Visit: Admitting: Nurse Practitioner

## 2023-11-20 LAB — HM MAMMOGRAPHY

## 2023-11-21 ENCOUNTER — Encounter: Payer: Self-pay | Admitting: Nurse Practitioner

## 2023-11-26 ENCOUNTER — Ambulatory Visit (INDEPENDENT_AMBULATORY_CARE_PROVIDER_SITE_OTHER)

## 2023-11-26 DIAGNOSIS — E538 Deficiency of other specified B group vitamins: Secondary | ICD-10-CM

## 2023-11-26 MED ORDER — CYANOCOBALAMIN 1000 MCG/ML IJ SOLN
1000.0000 ug | Freq: Once | INTRAMUSCULAR | Status: AC
  Administered 2023-11-26: 1000 ug via INTRAMUSCULAR

## 2023-11-26 NOTE — Progress Notes (Signed)
 Pt presented for their vitamin B12 injection. Pt was identified through two identifiers. Pt tolerated shot well in their right deltoid.

## 2023-11-27 ENCOUNTER — Ambulatory Visit: Admitting: Nurse Practitioner

## 2023-12-24 ENCOUNTER — Ambulatory Visit (INDEPENDENT_AMBULATORY_CARE_PROVIDER_SITE_OTHER)

## 2023-12-24 DIAGNOSIS — E538 Deficiency of other specified B group vitamins: Secondary | ICD-10-CM | POA: Diagnosis not present

## 2023-12-24 MED ORDER — CYANOCOBALAMIN 1000 MCG/ML IJ SOLN
1000.0000 ug | Freq: Once | INTRAMUSCULAR | Status: AC
  Administered 2023-12-24: 1000 ug via INTRAMUSCULAR

## 2023-12-24 NOTE — Progress Notes (Signed)
 Pt presented for their vitamin B12 injection. Pt was identified through two identifiers. Pt tolerated shot well in their left deltoid.

## 2024-01-23 ENCOUNTER — Ambulatory Visit

## 2024-01-24 ENCOUNTER — Ambulatory Visit (INDEPENDENT_AMBULATORY_CARE_PROVIDER_SITE_OTHER)

## 2024-01-24 ENCOUNTER — Telehealth: Payer: Self-pay

## 2024-01-24 DIAGNOSIS — E538 Deficiency of other specified B group vitamins: Secondary | ICD-10-CM | POA: Diagnosis not present

## 2024-01-24 MED ORDER — CYANOCOBALAMIN 1000 MCG/ML IJ SOLN
1000.0000 ug | Freq: Once | INTRAMUSCULAR | Status: AC
  Administered 2024-01-24: 1000 ug via INTRAMUSCULAR

## 2024-01-24 NOTE — Progress Notes (Signed)
 Patient is in office today for a nurse visit for B12 Injection. Patient Injection was given in the  Right deltoid. Patient tolerated injection well.

## 2024-01-24 NOTE — Telephone Encounter (Signed)
 Patient states at check-out that she would be interested in giving herself her B12 injections at home if this is possible.  Please call.

## 2024-02-21 ENCOUNTER — Ambulatory Visit (INDEPENDENT_AMBULATORY_CARE_PROVIDER_SITE_OTHER)

## 2024-02-21 DIAGNOSIS — E538 Deficiency of other specified B group vitamins: Secondary | ICD-10-CM

## 2024-02-21 MED ORDER — CYANOCOBALAMIN 1000 MCG/ML IJ SOLN
1000.0000 ug | Freq: Once | INTRAMUSCULAR | Status: AC
  Administered 2024-02-21: 1000 ug via INTRAMUSCULAR

## 2024-02-21 NOTE — Progress Notes (Signed)
 Patient is in office today for a nurse visit for B12 Injection. Patient Injection was given in the  Left deltoid. Patient tolerated injection well.

## 2024-03-24 ENCOUNTER — Ambulatory Visit

## 2024-03-26 ENCOUNTER — Encounter: Payer: Self-pay | Admitting: Nurse Practitioner

## 2024-03-26 ENCOUNTER — Ambulatory Visit: Admitting: Nurse Practitioner

## 2024-03-26 VITALS — BP 132/84 | HR 66 | Temp 98.4°F | Ht 65.0 in | Wt 174.8 lb

## 2024-03-26 DIAGNOSIS — E7800 Pure hypercholesterolemia, unspecified: Secondary | ICD-10-CM

## 2024-03-26 DIAGNOSIS — R7303 Prediabetes: Secondary | ICD-10-CM | POA: Diagnosis not present

## 2024-03-26 DIAGNOSIS — E559 Vitamin D deficiency, unspecified: Secondary | ICD-10-CM | POA: Diagnosis not present

## 2024-03-26 DIAGNOSIS — E538 Deficiency of other specified B group vitamins: Secondary | ICD-10-CM | POA: Insufficient documentation

## 2024-03-26 DIAGNOSIS — R42 Dizziness and giddiness: Secondary | ICD-10-CM

## 2024-03-26 DIAGNOSIS — I10 Essential (primary) hypertension: Secondary | ICD-10-CM

## 2024-03-26 DIAGNOSIS — R4789 Other speech disturbances: Secondary | ICD-10-CM | POA: Insufficient documentation

## 2024-03-26 DIAGNOSIS — Z23 Encounter for immunization: Secondary | ICD-10-CM | POA: Diagnosis not present

## 2024-03-26 MED ORDER — CYANOCOBALAMIN 1000 MCG/ML IJ SOLN
1000.0000 ug | Freq: Once | INTRAMUSCULAR | Status: AC
  Administered 2024-03-26: 1000 ug via INTRAMUSCULAR

## 2024-03-26 NOTE — Assessment & Plan Note (Signed)
 Managed with regular B12 injections. Continue monthly B12 injections at the clinic.

## 2024-03-26 NOTE — Progress Notes (Signed)
 Pt presented for their vitamin B12 injection. Pt was identified through two identifiers. Pt tolerated shot well in their right deltoid.

## 2024-03-26 NOTE — Assessment & Plan Note (Signed)
 Managed with pravastatin , with lipid levels monitored routinely. Continue pravastatin .

## 2024-03-26 NOTE — Assessment & Plan Note (Signed)
 Prediabetes is managed with diet control. Risk factors include family history and past gestational diabetes. Advise dietary modifications and encourage reduction of snack foods and stress eating. Check A1c today.

## 2024-03-26 NOTE — Assessment & Plan Note (Signed)
 Blood pressure is well-controlled on metoprolol , losartan , and hydrochlorothiazide . Continue current antihypertensive medications. Check CMP.

## 2024-03-26 NOTE — Progress Notes (Signed)
 Leron Glance, NP-C Phone: [phone]  Shelia Rush is a 58 y.o. female who presents today for follow up.   Discussed the use of AI scribe software for clinical note transcription with the patient, who gave verbal consent to proceed.  History of Present Illness   Shelia Rush is a 58 year old female who presents with persistent dizziness and fatigue.  She has been experiencing persistent dizziness for the past three months, primarily in the mornings and at night. The dizziness is described as a sensation of the room spinning, similar to being on a 'whirly world'. It is exacerbated by standing up from a sitting position or when lying down and is relieved by remaining still. She has a history of vertigo many years ago but notes that this current dizziness is different as it is not constant like her previous experience.  In addition to dizziness, she experiences significant fatigue, feeling the need to nap within two hours of waking despite sleeping well at night. She can sleep all night but still feels tired during the day. She also experiences forgetfulness and difficulty with word recall, describing situations where she can see the word in her mind but is unable to verbalize it. No history of stroke.  Her medical history includes hypertension, for which she is taking metoprolol  twice daily, losartan , and hydrochlorothiazide . She also takes pravastatin  for cholesterol management and vitamin D  supplements. She no longer experiences heart pounding or high blood pressure episodes since starting these medications.  Socially, she does not consume alcohol and primarily drinks water, with occasional lemonade and a weekly cup of sweet tea. She has significantly reduced her caffeine intake. She mentions a past cardiac workup, including wearing a heart monitor and undergoing scans, which revealed some calcium at the bottom of her heart.  No muscle weakness, ringing in ears, slurred speech,  shortness of breath, chest pain, or swelling. Reports increased fatigue, dizziness, and forgetfulness.      Social History   Tobacco Use  Smoking Status Never  Smokeless Tobacco Never    Current Outpatient Medications on File Prior to Visit  Medication Sig Dispense Refill   cholecalciferol (VITAMIN D3) 25 MCG (1000 UNIT) tablet Take 1,000 Units by mouth in the morning and at bedtime.     diphenhydramine -acetaminophen  (TYLENOL  PM) 25-500 MG TABS tablet Take 2 tablets by mouth at bedtime as needed.     hydrochlorothiazide  (MICROZIDE ) 12.5 MG capsule TAKE 1 CAPSULE BY MOUTH EACH MORNING 90 capsule 3   losartan  (COZAAR ) 25 MG tablet TAKE 1 TABLET BY MOUTH DAILY 90 tablet 3   metoprolol  tartrate (LOPRESSOR ) 25 MG tablet Take 1 tablet (25 mg total) by mouth 2 (two) times daily. 180 tablet 3   pravastatin  (PRAVACHOL ) 20 MG tablet Take 1 tablet (20 mg total) by mouth daily. After 6 pm 90 tablet 3   No current facility-administered medications on file prior to visit.     ROS see history of present illness  Objective  Physical Exam Vitals:   03/26/24 1407  BP: 132/84  Pulse: 66  Temp: 98.4 F (36.9 C)  SpO2: 99%    BP Readings from Last 3 Encounters:  03/26/24 132/84  11/02/23 122/80  09/25/23 120/76   Wt Readings from Last 3 Encounters:  03/26/24 174 lb 12.8 oz (79.3 kg)  11/02/23 178 lb 6.4 oz (80.9 kg)  09/25/23 177 lb 9.6 oz (80.6 kg)    Physical Exam Constitutional:      General: She is not in  acute distress.    Appearance: Normal appearance.  HENT:     Head: Normocephalic.  Cardiovascular:     Rate and Rhythm: Normal rate and regular rhythm.     Heart sounds: Normal heart sounds.  Pulmonary:     Effort: Pulmonary effort is normal.     Breath sounds: Normal breath sounds.  Skin:    General: Skin is warm and dry.  Neurological:     General: No focal deficit present.     Mental Status: She is alert and oriented to person, place, and time.     Cranial Nerves:  Cranial nerves 2-12 are intact. No dysarthria or facial asymmetry.     Sensory: Sensation is intact.     Motor: Motor function is intact. No weakness.  Psychiatric:        Mood and Affect: Mood normal.        Behavior: Behavior normal.      Assessment/Plan: Please see individual problem list.  Dizziness Assessment & Plan: She experiences persistent dizziness and vertigo with fatigue and expressive aphasia. Differential diagnosis includes vestibular migraines, vertigo, or neurological conditions. There is no muscle weakness, tinnitus, or slurred speech. Order a MRI brain rule out neurological causes. Consider neurology referral based on scan results. Discuss vestibular physical therapy if neurological causes are ruled out. Could also consider carotid US  and Zio monitor. Further work up pending results.   Orders: -     TSH -     MR BRAIN W WO CONTRAST; Future  Word finding difficulty -     MR BRAIN W WO CONTRAST; Future  Primary hypertension Assessment & Plan: Blood pressure is well-controlled on metoprolol , losartan , and hydrochlorothiazide . Continue current antihypertensive medications. Check CMP.   Orders: -     Comprehensive metabolic panel with GFR  Pure hypercholesterolemia Assessment & Plan: Managed with pravastatin , with lipid levels monitored routinely. Continue pravastatin .   Prediabetes Assessment & Plan: Prediabetes is managed with diet control. Risk factors include family history and past gestational diabetes. Advise dietary modifications and encourage reduction of snack foods and stress eating. Check A1c today.   Orders: -     Hemoglobin A1c  B12 deficiency Assessment & Plan: Managed with regular B12 injections. Continue monthly B12 injections at the clinic.   Orders: -     Cyanocobalamin   Vitamin D  deficiency -     VITAMIN D  25 Hydroxy (Vit-D Deficiency, Fractures)  Need for influenza vaccination -     Flu vaccine trivalent PF, 6mos and  older(Flulaval,Afluria,Fluarix,Fluzone)     Return in about 6 months (around 09/24/2024) for Annual Exam, sooner as needed.   Leron Glance, NP-C [facility]

## 2024-03-26 NOTE — Assessment & Plan Note (Signed)
 She experiences persistent dizziness and vertigo with fatigue and expressive aphasia. Differential diagnosis includes vestibular migraines, vertigo, or neurological conditions. There is no muscle weakness, tinnitus, or slurred speech. Order a MRI brain rule out neurological causes. Consider neurology referral based on scan results. Discuss vestibular physical therapy if neurological causes are ruled out. Could also consider carotid US  and Zio monitor. Further work up pending results.

## 2024-03-27 LAB — COMPREHENSIVE METABOLIC PANEL WITH GFR
ALT: 15 U/L (ref 0–35)
AST: 18 U/L (ref 0–37)
Albumin: 4.6 g/dL (ref 3.5–5.2)
Alkaline Phosphatase: 49 U/L (ref 39–117)
BUN: 14 mg/dL (ref 6–23)
CO2: 30 meq/L (ref 19–32)
Calcium: 9.9 mg/dL (ref 8.4–10.5)
Chloride: 99 meq/L (ref 96–112)
Creatinine, Ser: 0.72 mg/dL (ref 0.40–1.20)
GFR: 90.77 mL/min (ref >60.00)
Glucose, Bld: 109 mg/dL — ABNORMAL HIGH (ref 70–99)
Potassium: 3.9 meq/L (ref 3.5–5.1)
Sodium: 139 meq/L (ref 135–145)
Total Bilirubin: 0.5 mg/dL (ref 0.2–1.2)
Total Protein: 7.7 g/dL (ref 6.0–8.3)

## 2024-03-27 LAB — HEMOGLOBIN A1C: Hgb A1c MFr Bld: 6 % (ref 4.6–6.5)

## 2024-03-27 LAB — TSH: TSH: 1.21 u[IU]/mL (ref 0.35–5.50)

## 2024-03-27 LAB — VITAMIN D 25 HYDROXY (VIT D DEFICIENCY, FRACTURES): VITD: 26.86 ng/mL — ABNORMAL LOW (ref 30.00–100.00)

## 2024-04-03 ENCOUNTER — Telehealth: Payer: Self-pay | Admitting: Nurse Practitioner

## 2024-04-03 NOTE — Telephone Encounter (Signed)
 I spoke with pt to get her scheduled for MRI she states she needs something to relax her she's on 04/11/2024 at 09:30 am at [facility].  Pharmacy is [facility]  [phone]  Call pt [phone]  Thank you!

## 2024-04-08 ENCOUNTER — Other Ambulatory Visit: Payer: Self-pay | Admitting: Nurse Practitioner

## 2024-04-08 ENCOUNTER — Ambulatory Visit: Payer: Self-pay | Admitting: Nurse Practitioner

## 2024-04-08 DIAGNOSIS — F40240 Claustrophobia: Secondary | ICD-10-CM

## 2024-04-08 MED ORDER — LORAZEPAM 0.5 MG PO TABS: 0.5000 mg | ORAL_TABLET | Freq: Once | ORAL | 0 refills | Status: AC

## 2024-04-08 NOTE — Telephone Encounter (Signed)
Pt informed

## 2024-04-09 ENCOUNTER — Telehealth: Payer: Self-pay

## 2024-04-09 NOTE — Telephone Encounter (Signed)
 Copied from CRM #[id number]. Topic: Clinical - Prescription Issue >> Apr 09, 2024 11:00 AM Alfonso ORN wrote: Reason for CRM: pt rec message from pcp to increase vitamins to 2000 iu but pt has already been doing that and wants to confirm if she needs to increase the dosage still

## 2024-04-11 ENCOUNTER — Ambulatory Visit
Admission: RE | Admit: 2024-04-11 | Discharge: 2024-04-11 | Disposition: A | Source: Ambulatory Visit | Attending: Nurse Practitioner

## 2024-04-11 DIAGNOSIS — R42 Dizziness and giddiness: Secondary | ICD-10-CM | POA: Diagnosis present

## 2024-04-11 DIAGNOSIS — R4789 Other speech disturbances: Secondary | ICD-10-CM | POA: Insufficient documentation

## 2024-04-11 MED ORDER — GADOBUTROL 1 MMOL/ML IV SOLN
7.5000 mL | Freq: Once | INTRAVENOUS | Status: AC | PRN
  Administered 2024-04-11: 7.5 mL via INTRAVENOUS

## 2024-04-28 ENCOUNTER — Ambulatory Visit (INDEPENDENT_AMBULATORY_CARE_PROVIDER_SITE_OTHER)

## 2024-04-28 DIAGNOSIS — E538 Deficiency of other specified B group vitamins: Secondary | ICD-10-CM | POA: Diagnosis not present

## 2024-04-28 MED ORDER — CYANOCOBALAMIN 1000 MCG/ML IJ SOLN
1000.0000 ug | Freq: Once | INTRAMUSCULAR | Status: AC
  Administered 2024-04-28: 1000 ug via INTRAMUSCULAR

## 2024-04-28 NOTE — Progress Notes (Signed)
 Pt presented for their vitamin B12 injection. Pt was identified through two identifiers. Pt tolerated shot well in their left deltoid.

## 2024-04-29 ENCOUNTER — Other Ambulatory Visit: Payer: Self-pay | Admitting: Nurse Practitioner

## 2024-04-29 DIAGNOSIS — R42 Dizziness and giddiness: Secondary | ICD-10-CM

## 2024-04-29 DIAGNOSIS — R4789 Other speech disturbances: Secondary | ICD-10-CM

## 2024-06-02 ENCOUNTER — Ambulatory Visit

## 2024-06-02 DIAGNOSIS — E538 Deficiency of other specified B group vitamins: Secondary | ICD-10-CM

## 2024-06-02 MED ORDER — CYANOCOBALAMIN 1000 MCG/ML IJ SOLN
1000.0000 ug | Freq: Once | INTRAMUSCULAR | Status: AC
  Administered 2024-06-02: 1000 ug via INTRAMUSCULAR

## 2024-06-02 NOTE — Progress Notes (Signed)
 Patient was administered a B12 injection into her left deltoid. Patient tolerated the B12 injection well.

## 2024-07-04 ENCOUNTER — Encounter: Payer: Self-pay | Admitting: Nurse Practitioner

## 2024-07-07 ENCOUNTER — Ambulatory Visit

## 2024-09-25 ENCOUNTER — Encounter: Admitting: Nurse Practitioner
